# Patient Record
Sex: Female | Born: 1960 | Race: White | Hispanic: No | Marital: Married | Smoking: Never smoker
Health system: Southern US, Community
[De-identification: ages and names within clinical notes are randomized; demographics above are authoritative.]

## PROBLEM LIST (undated history)

## (undated) DIAGNOSIS — Z9889 Other specified postprocedural states: Secondary | ICD-10-CM

## (undated) DIAGNOSIS — K219 Gastro-esophageal reflux disease without esophagitis: Secondary | ICD-10-CM

## (undated) DIAGNOSIS — I10 Essential (primary) hypertension: Secondary | ICD-10-CM

## (undated) DIAGNOSIS — D649 Anemia, unspecified: Secondary | ICD-10-CM

## (undated) DIAGNOSIS — R112 Nausea with vomiting, unspecified: Secondary | ICD-10-CM

## (undated) DIAGNOSIS — F419 Anxiety disorder, unspecified: Secondary | ICD-10-CM

## (undated) DIAGNOSIS — F32A Depression, unspecified: Secondary | ICD-10-CM

## (undated) DIAGNOSIS — E785 Hyperlipidemia, unspecified: Secondary | ICD-10-CM

## (undated) DIAGNOSIS — IMO0002 Reserved for concepts with insufficient information to code with codable children: Secondary | ICD-10-CM

## (undated) DIAGNOSIS — F329 Major depressive disorder, single episode, unspecified: Secondary | ICD-10-CM

## (undated) HISTORY — DX: Hyperlipidemia, unspecified: E78.5

## (undated) HISTORY — DX: Reserved for concepts with insufficient information to code with codable children: IMO0002

## (undated) HISTORY — PX: CHOLECYSTECTOMY: SHX55

## (undated) HISTORY — DX: Essential (primary) hypertension: I10

## (undated) HISTORY — DX: Depression, unspecified: F32.A

## (undated) HISTORY — DX: Gastro-esophageal reflux disease without esophagitis: K21.9

## (undated) HISTORY — DX: Anxiety disorder, unspecified: F41.9

## (undated) HISTORY — DX: Major depressive disorder, single episode, unspecified: F32.9

---

## 1984-02-11 HISTORY — PX: ABDOMINAL HYSTERECTOMY: SHX81

## 1994-02-10 HISTORY — PX: INCONTINENCE SURGERY: SHX676

## 1997-09-19 ENCOUNTER — Ambulatory Visit (HOSPITAL_COMMUNITY): Admission: RE | Admit: 1997-09-19 | Discharge: 1997-09-19 | Payer: Self-pay | Admitting: Obstetrics and Gynecology

## 1997-09-26 ENCOUNTER — Ambulatory Visit (HOSPITAL_COMMUNITY): Admission: RE | Admit: 1997-09-26 | Discharge: 1997-09-26 | Payer: Self-pay | Admitting: *Deleted

## 1999-10-21 ENCOUNTER — Encounter: Payer: Self-pay | Admitting: Urology

## 1999-10-23 ENCOUNTER — Ambulatory Visit (HOSPITAL_COMMUNITY): Admission: RE | Admit: 1999-10-23 | Discharge: 1999-10-24 | Payer: Self-pay | Admitting: Urology

## 1999-12-12 ENCOUNTER — Other Ambulatory Visit: Admission: RE | Admit: 1999-12-12 | Discharge: 1999-12-12 | Payer: Self-pay | Admitting: *Deleted

## 2000-03-12 ENCOUNTER — Encounter: Admission: RE | Admit: 2000-03-12 | Discharge: 2000-03-12 | Payer: Self-pay | Admitting: Family Medicine

## 2000-03-12 ENCOUNTER — Encounter: Payer: Self-pay | Admitting: Family Medicine

## 2002-03-15 ENCOUNTER — Encounter: Admission: RE | Admit: 2002-03-15 | Discharge: 2002-03-15 | Payer: Self-pay | Admitting: Family Medicine

## 2002-03-15 ENCOUNTER — Encounter: Payer: Self-pay | Admitting: Family Medicine

## 2002-12-22 ENCOUNTER — Encounter: Admission: RE | Admit: 2002-12-22 | Discharge: 2002-12-22 | Payer: Self-pay | Admitting: Obstetrics and Gynecology

## 2003-06-11 ENCOUNTER — Encounter (INDEPENDENT_AMBULATORY_CARE_PROVIDER_SITE_OTHER): Payer: Self-pay | Admitting: *Deleted

## 2003-06-11 LAB — CONVERTED CEMR LAB

## 2003-07-19 ENCOUNTER — Other Ambulatory Visit: Admission: RE | Admit: 2003-07-19 | Discharge: 2003-07-19 | Payer: Self-pay | Admitting: Obstetrics & Gynecology

## 2003-10-30 ENCOUNTER — Ambulatory Visit: Payer: Self-pay | Admitting: Family Medicine

## 2003-11-16 ENCOUNTER — Ambulatory Visit: Payer: Self-pay | Admitting: Family Medicine

## 2003-11-21 ENCOUNTER — Ambulatory Visit: Payer: Self-pay | Admitting: Family Medicine

## 2004-01-30 ENCOUNTER — Ambulatory Visit: Payer: Self-pay | Admitting: Family Medicine

## 2004-02-22 ENCOUNTER — Ambulatory Visit: Payer: Self-pay | Admitting: Family Medicine

## 2004-05-16 ENCOUNTER — Ambulatory Visit: Payer: Self-pay | Admitting: Sports Medicine

## 2004-06-17 ENCOUNTER — Emergency Department (HOSPITAL_COMMUNITY): Admission: EM | Admit: 2004-06-17 | Discharge: 2004-06-17 | Payer: Self-pay | Admitting: Family Medicine

## 2004-06-27 ENCOUNTER — Ambulatory Visit: Payer: Self-pay | Admitting: Family Medicine

## 2004-08-05 ENCOUNTER — Ambulatory Visit: Payer: Self-pay | Admitting: Family Medicine

## 2004-09-20 ENCOUNTER — Ambulatory Visit: Payer: Self-pay | Admitting: Family Medicine

## 2004-11-17 ENCOUNTER — Emergency Department (HOSPITAL_COMMUNITY): Admission: EM | Admit: 2004-11-17 | Discharge: 2004-11-17 | Payer: Self-pay | Admitting: Family Medicine

## 2005-01-16 ENCOUNTER — Ambulatory Visit: Payer: Self-pay | Admitting: Family Medicine

## 2005-01-21 ENCOUNTER — Encounter: Admission: RE | Admit: 2005-01-21 | Discharge: 2005-01-21 | Payer: Self-pay | Admitting: Sports Medicine

## 2005-06-25 ENCOUNTER — Ambulatory Visit: Payer: Self-pay | Admitting: Family Medicine

## 2005-11-03 ENCOUNTER — Ambulatory Visit: Payer: Self-pay | Admitting: Family Medicine

## 2006-02-19 ENCOUNTER — Encounter (INDEPENDENT_AMBULATORY_CARE_PROVIDER_SITE_OTHER): Payer: Self-pay | Admitting: Family Medicine

## 2006-02-19 ENCOUNTER — Ambulatory Visit: Payer: Self-pay | Admitting: Sports Medicine

## 2006-02-19 LAB — CONVERTED CEMR LAB
Sex Hormone Binding: 43 nmol/L (ref 18–114)
Testosterone Free: 2 pg/mL (ref 0.6–6.8)
Testosterone-% Free: 1.5 % (ref 0.4–2.4)
Testosterone: 13.13 ng/dL (ref 10–70)

## 2006-02-25 ENCOUNTER — Ambulatory Visit: Payer: Self-pay | Admitting: Family Medicine

## 2006-04-07 ENCOUNTER — Encounter: Admission: RE | Admit: 2006-04-07 | Discharge: 2006-04-07 | Payer: Self-pay | Admitting: Sports Medicine

## 2006-04-07 ENCOUNTER — Ambulatory Visit: Payer: Self-pay | Admitting: Family Medicine

## 2006-04-09 DIAGNOSIS — F528 Other sexual dysfunction not due to a substance or known physiological condition: Secondary | ICD-10-CM | POA: Insufficient documentation

## 2006-04-09 DIAGNOSIS — F339 Major depressive disorder, recurrent, unspecified: Secondary | ICD-10-CM | POA: Insufficient documentation

## 2006-04-09 DIAGNOSIS — M545 Low back pain, unspecified: Secondary | ICD-10-CM | POA: Insufficient documentation

## 2006-04-09 DIAGNOSIS — K219 Gastro-esophageal reflux disease without esophagitis: Secondary | ICD-10-CM | POA: Insufficient documentation

## 2006-04-09 DIAGNOSIS — G4700 Insomnia, unspecified: Secondary | ICD-10-CM | POA: Insufficient documentation

## 2006-04-09 DIAGNOSIS — F411 Generalized anxiety disorder: Secondary | ICD-10-CM | POA: Insufficient documentation

## 2006-04-09 DIAGNOSIS — I10 Essential (primary) hypertension: Secondary | ICD-10-CM | POA: Insufficient documentation

## 2006-04-09 DIAGNOSIS — N951 Menopausal and female climacteric states: Secondary | ICD-10-CM | POA: Insufficient documentation

## 2006-04-10 ENCOUNTER — Encounter (INDEPENDENT_AMBULATORY_CARE_PROVIDER_SITE_OTHER): Payer: Self-pay | Admitting: *Deleted

## 2006-04-22 ENCOUNTER — Encounter (INDEPENDENT_AMBULATORY_CARE_PROVIDER_SITE_OTHER): Payer: Self-pay | Admitting: Family Medicine

## 2006-07-09 ENCOUNTER — Encounter: Admission: RE | Admit: 2006-07-09 | Discharge: 2006-07-09 | Payer: Self-pay | Admitting: Neurosurgery

## 2006-07-31 ENCOUNTER — Encounter (INDEPENDENT_AMBULATORY_CARE_PROVIDER_SITE_OTHER): Payer: Self-pay | Admitting: Family Medicine

## 2006-08-13 ENCOUNTER — Encounter: Admission: RE | Admit: 2006-08-13 | Discharge: 2006-08-13 | Payer: Self-pay | Admitting: Neurosurgery

## 2006-09-03 ENCOUNTER — Encounter: Admission: RE | Admit: 2006-09-03 | Discharge: 2006-09-03 | Payer: Self-pay | Admitting: Orthopaedic Surgery

## 2006-09-24 ENCOUNTER — Encounter: Admission: RE | Admit: 2006-09-24 | Discharge: 2006-09-24 | Payer: Self-pay | Admitting: Orthopaedic Surgery

## 2006-10-20 ENCOUNTER — Ambulatory Visit: Payer: Self-pay | Admitting: Gastroenterology

## 2006-10-26 ENCOUNTER — Encounter
Admission: RE | Admit: 2006-10-26 | Discharge: 2006-10-27 | Payer: Self-pay | Admitting: Physical Medicine & Rehabilitation

## 2006-10-27 ENCOUNTER — Ambulatory Visit: Payer: Self-pay | Admitting: Physical Medicine & Rehabilitation

## 2006-11-18 ENCOUNTER — Ambulatory Visit: Payer: Self-pay | Admitting: Gastroenterology

## 2007-04-29 DIAGNOSIS — I10 Essential (primary) hypertension: Secondary | ICD-10-CM | POA: Insufficient documentation

## 2008-04-25 ENCOUNTER — Encounter: Admission: RE | Admit: 2008-04-25 | Discharge: 2008-04-25 | Payer: Self-pay | Admitting: Obstetrics and Gynecology

## 2009-04-26 ENCOUNTER — Encounter: Admission: RE | Admit: 2009-04-26 | Discharge: 2009-04-26 | Payer: Self-pay | Admitting: Obstetrics and Gynecology

## 2009-07-25 ENCOUNTER — Encounter: Admission: RE | Admit: 2009-07-25 | Discharge: 2009-07-25 | Payer: Self-pay | Admitting: Internal Medicine

## 2010-02-07 ENCOUNTER — Emergency Department (HOSPITAL_COMMUNITY)
Admission: EM | Admit: 2010-02-07 | Discharge: 2010-02-07 | Payer: Self-pay | Source: Home / Self Care | Admitting: Family Medicine

## 2010-02-10 ENCOUNTER — Emergency Department (HOSPITAL_COMMUNITY)
Admission: EM | Admit: 2010-02-10 | Discharge: 2010-02-10 | Payer: Self-pay | Source: Home / Self Care | Admitting: Emergency Medicine

## 2010-03-14 NOTE — Procedures (Signed)
Summary: Gastroenterology EGD  Gastroenterology EGD   Imported By: Lowry Ram CMA 05/10/2007 12:12:06  _____________________________________________________________________  External Attachment:    Type:   Image     Comment:   External Document

## 2010-03-14 NOTE — Consult Note (Signed)
Summary: [facility]  [facility]   Imported By: Haydee Salter 05/14/2006 14:54:37  _____________________________________________________________________  External Attachment:    Type:   Image     Comment:   External Document

## 2010-03-14 NOTE — Consult Note (Signed)
Summary: [facility]  [facility]   Imported By: Bradly Bienenstock 07/31/2006 08:55:00  _____________________________________________________________________  External Attachment:    Type:   Image     Comment:   External Document

## 2010-04-22 LAB — POCT RAPID STREP A (OFFICE): Streptococcus, Group A Screen (Direct): POSITIVE — AB

## 2010-06-25 NOTE — Assessment & Plan Note (Signed)
[facility]                         GASTROENTEROLOGY OFFICE NOTE   Tiffany Bean, Tiffany Bean                      MRN:          [id number]  DATE:10/20/2006                            DOB:          [birth year]    REFERRING PHYSICIAN:  Gaspar Garbe, M.D.   REASON FOR REFERRAL:  Dr. Wylene Simmer asked me to evaluate Tiffany Bean in  consultation regarding dyspepsia, GERD symptoms, belching, H. pylori  positive.   HISTORY OF PRESENT ILLNESS:  Tiffany Bean is a very pleasant 50 year old  woman who has had several years of classic GERD symptoms.  She describes  burning in her chest, indigestion, belching that would be quite severe  if she did not take any of her omeprazole.  She takes omeprazole on a  nightly basis shortly before she goes to bed.  She has had 3 episodes of  hiccups and belching with worse-than-usual acid taste in her mouth.  These episodes have been over the past year or so and can last as long  as 1 week.  She said only time usually helps with these.  She has had a  separate issue of left-sided spasms that will occur when she twists or  turns in specific positions.  The pain will last for 30 seconds and then  will go away.  She will also rub her side to make it get better.  This  happens approximately 2 to 3 times a day.   She has no dysphagia.  She has no vomiting.  No nausea.   REVIEW OF SYSTEMS:  Notable for a 25-pound weight gain in the past year.  Otherwise, is essentially normal and noted on the nursing intake sheet.   PAST MEDICAL HISTORY:  Hypertension status post cholecystectomy in 1995,  hysterectomy 1997, tubal ligation 1986.   CURRENT MEDICATIONS:  Femtrace.  Bupropion.  Hydrochlorothiazide.  Fluoxetine.  Omeprazole nightly.   ALLERGIES:  CODEINE causes nausea and vomiting.   SOCIAL HISTORY:  Single.  Used to work at [employer] at [employer].  Nondrinker.  Nonsmoker.  Does not consume much caffeine, peppermint, or   chocolate.   FAMILY HISTORY:  No colon cancer or colon polyps in the family.   PHYSICAL EXAM:  Height 5 feet 2 inches, 233 pounds for a calculated BMI  of 43.  Blood pressure 122/80, pulse 60.  CONSTITUTIONAL:  Morbidly obese, otherwise well-appearing.  NEUROLOGIC:  Alert and oriented x3.  EYES:  Extraocular movements are intact.  MOUTH:  Oropharynx moist.  No lesions.  NECK:  Supple.  No lymphadenopathy.  CARDIOVASCULAR:  Heart is regular rate and rhythm.  LUNGS:  Clear to auscultation bilaterally.  ABDOMEN:  Soft, nontender, nondistended, normal bowel sounds.  EXTREMITIES:  No lower extremity edema.  SKIN:  No rashes or lesions on the visible extremities.   ASSESSMENT AND PLAN:  A 50 year old woman with classic gastroesophageal  reflux disease symptoms with probably acute worsening, perhaps  esophagitis.   I did not mention above, but she has had recent blood tests for a CBC  showing a slightly elevated white count of 14.6.  She is not  anemic.  Complete metabolic profile was normal.  She was H. pylori positive on  lab testing.  Her urinalysis was fairly normal.   I do think she has GERD symptoms but she is not taking omeprazole at the  correct time in relation to foods.  I recommend she change that to  taking it 20-30 minutes prior to her dinner meals as that is the most  reliable meal of the day.  I will give her a GERD handout.  We will also  arrange for her to have an EGD performed after 3 to 4 weeks of this  change in her antacid medicines.  The left-sided pain is very  positional.  The pain lasts only 20-30 seconds.  It is most likely  musculoskeletal.  I told her that, perhaps some of this is due to her  dramatic weight gain in the past couple of years.  Tylenol taken once or  twice a day may help this left-sided discomfort.     Rachael Fee, MD  Electronically Signed    DPJ/MedQ  DD: 10/20/2006  DT: 10/21/2006  Job #: [id number]   cc:   Gaspar Garbe, M.D.

## 2010-06-25 NOTE — Group Therapy Note (Signed)
Ms. Ringer is a 50 year old female.  Consult requested by Dr. Hilda Lias.  Consult requested for the evaluation of cervicalgia and  lumbago; pain management requested.   Ms. Sagen is a pleasant 50 year old female who gives the 49-month  history of hip pain, 70-month history of knee pain, on-and-off history of  elbow pain, and longer-term neck and headache pain.  She has had  neurosurgical evaluation per Dr. Jeral Fruit April 22, 2006, felt to have  possible L2-3 disc versus neuralgia paresthetica, given five Toradol.  She has had cervical and lumbar myelogram indicating minimal central  disc L2-3 without significant stenosis, mild facet hypertrophy L4-5 and  L5-S1, central disc at L5-S1 without stenosis.  On her cervical spine  she had foraminal narrowing of left greater than right at C5-6, C6-7;  central canal stenosis at C5-6 with moderate narrowing due to disc  osteophyte complex; mild central canal narrowing at C4-5 due to broad-  based disc bulge; and moderate to severe right foraminal narrowing at C3-  4.   She has had pelvic x-rays back in February ordered by Dr. Darrick Penna showing  transitional lumbosacral vertebra with prominent transverse processes  articulating or possibly fused with sacrum.  SI joints looked okay.  She  has had a lateral femoral cutaneous nerve injection per Dr. Jeral Fruit but  this did not result in any improvement in her left hip pain.  She has  also had some flank pain on the left side which is intermittent in  nature.   Her average pain is 7 but currently 5.  Described as burning, dull,  stabbing, constant, aching.  Pain increases with activity, is about a  7/10 level, sleep is poor.  Relief from medications is fair.  She can  walk without limits, she can drive.  She does not climb steps due to  knee pain, particularly with descending stairs.  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   REVIEW OF SYSTEMS:  Positive for numbness in her left leg, trouble  walking, spasms, depression.  She has diarrhea only when she has  headache or leg pain flareups.  She has gained 25 pounds over the last  year or maybe 2 years.   She has had medical evaluation including normal thyroid function tests.   PAST SURGICAL HISTORY:  1. Tubal ligation 1986.  2. Cholecystectomy 1993.  3. Hysterectomy 1997.  4. Pubovaginal sling 2001.   SOCIAL HISTORY:  Married, lives with her husband.  No alcohol, smoking  or illegal drug abuse.   FAMILY HISTORY:  Father with diabetes.  Has family history of heart  disease, lung disease and disability.   OTHER TREATMENTS TRIALED:  She has had L4-5 translaminar epidural  steroid injection August 14 and September 03, 2006 with only a couple of  days' relief each time.   Her blood pressure is 122/55, pulse 71, respirations 18, O2 saturation  94% in room air.  In general, no acute distress.  Mood and affect  appropriate.  Her back has no tenderness to palpation.  She has no  evidence of scoliosis.  She has pain with extension greater than with  flexion.  She has upper extremity and lower extremity strength and range  of motion normal, but she does have some back pain with Faber's maneuver  bilaterally in the SI  area.  Straight leg raising test is negative.  Deep tendon reflexes are normal.  She has mild crepitus bilateral knees.  Hip ROM good. She has no joint swelling in the knees, no erythema.  Her  lower extremity pulses are normal.  Mood and affect is appropriate.  Her  neck range of motion is good.  She has no pain with palpation of  fibromyalgia tender points.   IMPRESSION:  1. Cervical spinal stenosis without evidence of radiculopathy.  I do      think she has cervicogenic headaches.  2. Lumbar facet arthropathy with probable main pain generator.  This      will be further  evaluated with lumbar medial branch blocks.  I      suspect her hip pain may be related to her lumbar facet arthropathy      as well.  3. Knee pain.  Given her weight I would be suspicious for early      osteoarthritis.  I have discussed weight management as part of      overall treatment plan.  She has recently cut out sodas but would      be potentially interested in some nutritional counseling, which can      be performed at [facility].   Also, I think that she can benefit from physical therapy but I would  first do medial branch blocks in order to better identify pain  generator.  She may be a candidate for lumbar radiofrequency neurotomy,  depending on response to medical branch blocks.   Thank you very much for this interesting consultation.      Erick Colace, M.D.  Electronically Signed     AEK/MedQ  D:  10/27/2006 15:43:57  T:  10/28/2006 08:57:53  Job #:  [id number]   cc:   Hilda Lias, M.D.  Fax: [phone]   Dr. Reatha Harps, M.D.  Fax: [phone]

## 2010-06-28 NOTE — H&P (Signed)
[place]  Patient:    Tiffany Bean, Tiffany Bean                      MRN: [id number] Adm. Date:  [id number] Attending:  Lauree Chandler                         History and Physical  HISTORY OF PRESENT ILLNESS:  This 50 year old white female has had a several-month history of classic stress incontinence.  She leaks with sneezing, coughing, and laughing.  She has no urgency incontinence.  She needs to wear pads.  She finds this very embarrassing.  Urodynamic studies revealed no uninhibited bladder contractions.  She did leak with bearing down and coughing at medium to high pressures.  She was counseled about therapy, and wants to go ahead with a pubovaginal sling, and she is advised about the risks of bladder injury, hemorrhage, or infection.  She knows about her suprapubic tube.  PAST MEDICAL HISTORY: 1. She has had a vaginal hysterectomy in the past. 2. She has had two vaginal deliveries. 3. Recently she has had some problems with urinary tract infections. 4. Cervical arthritis. 5. Esophageal reflux. 6. She has some depression. 7. A cholecystectomy.  CURRENT MEDICATIONS: 1. Alprazolam 0.5 mg q.d. 2. Prozac 40 mg q.d. 3. Protonix 40 mg q.d. 4. Vioxx 25 mg q.d.  ALLERGIES:  She cannot tolerate CODEINE.  SOCIAL HISTORY:  Tobacco:  None.  Alcohol:  None.  REVIEW OF SYSTEMS:  Noted in her health history form.  PHYSICAL EXAMINATION:  VITAL SIGNS:  Blood pressure 152/80, pulse 50, heart rate 16, temperature 97.1 degrees.  GENERAL:  She is alert and oriented.  She is in no acute distress.  She is a heavyset white female.  SKIN:  Warm and dry.  ABDOMEN:  Soft, nontender.  LUNGS:  No respiratory distress.  GENITOURINARY:  She has a mild cystocele.  External genitalia otherwise unremarkable.  HEART:  She has had a workup for mild cardiomegaly, which an echocardiogram did not reveal any serious dysfunction.  IMPRESSION: 1. Stress  incontinence. 2. Gastroesophageal reflux disease. 3. Cervical spine arthritis. 4. Cardiomegaly.  PLAN:  A pubovaginal sling. DD:  10/23/99 TD:  10/23/99 Job: [id number]

## 2010-06-28 NOTE — Op Note (Signed)
[place]  Patient:    Tiffany Bean, Tiffany Bean                      MRN: [id number] Proc. Date: 10/23/99 Adm. Date:  [id number] Disc. Date: [id number] Attending:  Lauree Chandler                           Operative Report  PREOPERATIVE DIAGNOSIS:  Stress urinary incontinence.  POSTOPERATIVE DIAGNOSIS:  Stress urinary incontinence.  PROCEDURE:  Pubovaginal sling.  SURGEON:  Maretta Bees. Vonita Moss, M.D.  ANESTHESIA:  General.  INDICATIONS:  This 50 year old had has significant stress urinary incontinence and wearing pads and has undergone workup that indicated a pubovaginal sling would be a good procedure for her.  PROCEDURE:  The patient brought to the operating room and placed in lithotomy position.  External genitalia were prepped and draped in usual fashion.  Foley catheter was inserted.  The vaginal mucosa was injected suburethrally with Xylocaine and epinephrine.  Midline incision was made suburethrally and vaginal flaps dissected out laterally to obtain access to the endopelvic fascia.  Attention was then turned to the suprapubic area where two, 1 inch incisions were made on each side of the midline transversely.  Dissection was carried down to the rectus fascia.  The fascial graft was previously soaked in triple antibiotic solution and 1 Prolene sutured into each end and at this point the Raz needle holder was used to penetrate the rectus fascia above the symphysis pubis after we had perforated the endopelvic fascia bilaterally. The Lynder Parents was brought down through each suprapubic incisional site, and into the periurethral area to bring out the Prolenes on each side.  The fascial graft laid in good position and was sutured in place from the bladder neck and distally with a 3-0 Vicryl proximally and a 3-0 Vicryl distally.  The wound was irrigated with triple antibiotic solution and the vaginal incision closed with running 2-0 Vicryl.  I then  cystoscoped her and the bladder was unremarkable and tension on the Prolene sutures on each side showed slight movement on each side of the bladder neck well away from the ureteral orifices.  With the bladder full and the patient in Trendelenburg position the microinvasive suprapubic tube was inserted percutaneously through a stabilized wound and brought through the anterior bladder wall just distal to the air bubble.  A foil coil was made in the suprapubic tube and it was later connected to close drainage and sutured in place with a black silk suture.  The Prolene sutures on each side of the wound securing the fascial graft were tied down at skin level over scissors and then sutures tied and the wounds irrigated with triple antibiotic solution and the suprapubic wounds closed with skin staples.  The vaginal canal was packed with Neosporin soaked gauze. Sponge, needle and instrument counts were correct.  The patient tolerated the procedure well.  ESTIMATED BLOOD LOSS:  150-200 cc.  DISPOSITION:  She was taken to the recovery room in good condition. DD:  10/23/99 TD:  10/24/99 Job: [id number]

## 2010-08-08 ENCOUNTER — Telehealth (INDEPENDENT_AMBULATORY_CARE_PROVIDER_SITE_OTHER): Payer: Self-pay

## 2010-08-08 ENCOUNTER — Other Ambulatory Visit (INDEPENDENT_AMBULATORY_CARE_PROVIDER_SITE_OTHER): Payer: Self-pay

## 2011-02-26 ENCOUNTER — Encounter (INDEPENDENT_AMBULATORY_CARE_PROVIDER_SITE_OTHER): Payer: Self-pay | Admitting: Surgery

## 2011-02-26 ENCOUNTER — Ambulatory Visit (INDEPENDENT_AMBULATORY_CARE_PROVIDER_SITE_OTHER): Payer: BC Managed Care – PPO | Admitting: Surgery

## 2011-02-26 VITALS — BP 132/78 | HR 76 | Temp 99.0°F | Resp 16 | Ht 62.0 in | Wt 241.0 lb

## 2011-02-26 DIAGNOSIS — E669 Obesity, unspecified: Secondary | ICD-10-CM | POA: Insufficient documentation

## 2011-02-26 NOTE — Progress Notes (Signed)
Chief Complaint:  Morbid obesity BMI 44  History of Present Illness:  Tiffany Bean is an 51 y.o. female [employer] employee who is interested in gastric bypass surgery. 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 Since she gained 60 pounds with her first child she has been unsuccessful in managing her obesity successfully. Last year she lost almost 50 pounds and doing a non-eating diet but has regained. She wants to proceed with workup for microscopic Roux-en-Y gastric bypass.  Past Medical History  Diagnosis Date  . Hypertension   . Hyperlipidemia   . Anxiety   . Depression   . GERD (gastroesophageal reflux disease)   . Degenerative disc disease     Past Surgical History  Procedure Date  . Cholecystectomy 1995?  Marland Kitchen Abdominal hysterectomy 1986  . Incontinence surgery 1996    Current Outpatient Prescriptions  Medication Sig Dispense Refill  . buPROPion (WELLBUTRIN XL) 300 MG 24 hr tablet Take 300 mg by mouth daily.      Marland Kitchen Dexlansoprazole (DEXILANT PO) Take 50 mg by mouth 1 day or 1 dose.      . DULoxetine (CYMBALTA) 60 MG capsule Take 60 mg by mouth daily.      . hydrochlorothiazide (HYDRODIURIL) 25 MG tablet Take 25 mg by mouth daily.      . phentermine (ADIPEX-P) 37.5 MG tablet Take 37.5 mg by mouth daily before breakfast.      . simvastatin (ZOCOR) 40 MG tablet Take 40 mg by mouth every evening.       Codeine Family History  Problem Relation Age of Onset  . Heart disease Father   . COPD Father    Social History:   reports that she has never smoked. She does not have any smokeless tobacco history on file. She reports that she does not drink alcohol or use illicit drugs.   REVIEW OF SYSTEMS - PERTINENT POSITIVES ONLY: Noncontributory.  Physical Exam:   Blood pressure 132/78, pulse 76, temperature 99 F (37.2 C), temperature source Temporal, resp. rate 16, height 5\' 2"  (1.575 m), weight 241 lb  (109.317 kg). Body mass index is 44.08 kg/(m^2).  Gen:  WDWN white female NAD  Neurological: Alert and oriented to person, place, and time. Motor and sensory function is grossly intact  Head: Normocephalic and atraumatic.  Eyes: Conjunctivae are normal. Pupils are equal, round, and reactive to light. No scleral icterus.  Neck: Normal range of motion. Neck supple. No tracheal deviation or thyromegaly present.  Cardiovascular:  SR without murmurs or gallops.  No carotid bruits Respiratory: Effort normal.  No respiratory distress. No chest wall tenderness. Breath sounds normal.  No wheezes, rales or rhonchi.  Abdomen:  Not examined GU: Musculoskeletal: Normal range of motion. Extremities are nontender. No cyanosis, edema or clubbing noted Lymphadenopathy: No cervical, preauricular, postauricular or axillary adenopathy is present Skin: Skin is warm and dry. No rash noted. No diaphoresis. No erythema. No pallor. Pscyh: Normal mood and affect. Behavior is normal. Judgment and thought content normal.   LABORATORY RESULTS: No results found for this or any previous visit (from the past 48 hour(s)).  RADIOLOGY RESULTS: No results found.  Problem List: Patient Active Problem List  Diagnoses  . DEPRESSION, MAJOR, RECURRENT  . ANXIETY  . IMPOTENCE INORGANIC  . HYPERTENSION, BENIGN SYSTEMIC  . HYPERTENSION  . GASTROESOPHAGEAL REFLUX, NO ESOPHAGITIS  . MENOPAUSAL SYNDROME  . BACK PAIN, LOW  .  INSOMNIA NOS  BMI 44, no history of DVT but with essential hypertension. Followed by Dr. Rufina Falco We'll go ahead and begin the workup for Roux-en-Y gastric bypass.    Matt B. Daphine Deutscher, MD, [facility] [phone] beeper [phone]  02/26/2011 5:05 PM

## 2011-03-03 ENCOUNTER — Other Ambulatory Visit (INDEPENDENT_AMBULATORY_CARE_PROVIDER_SITE_OTHER): Payer: Self-pay | Admitting: General Surgery

## 2011-03-04 ENCOUNTER — Other Ambulatory Visit: Payer: Self-pay | Admitting: Nurse Practitioner

## 2011-03-04 DIAGNOSIS — Z1231 Encounter for screening mammogram for malignant neoplasm of breast: Secondary | ICD-10-CM

## 2011-03-06 ENCOUNTER — Ambulatory Visit
Admission: RE | Admit: 2011-03-06 | Discharge: 2011-03-06 | Disposition: A | Discharge status: Home / Self Care | Payer: BC Managed Care – PPO | Source: Ambulatory Visit | Attending: Nurse Practitioner | Admitting: Nurse Practitioner

## 2011-03-06 DIAGNOSIS — Z1231 Encounter for screening mammogram for malignant neoplasm of breast: Secondary | ICD-10-CM

## 2011-03-11 ENCOUNTER — Other Ambulatory Visit: Payer: Self-pay | Admitting: Nurse Practitioner

## 2011-03-11 DIAGNOSIS — R928 Other abnormal and inconclusive findings on diagnostic imaging of breast: Secondary | ICD-10-CM

## 2011-03-12 ENCOUNTER — Ambulatory Visit
Admission: RE | Admit: 2011-03-12 | Discharge: 2011-03-12 | Disposition: A | Discharge status: Home / Self Care | Payer: BC Managed Care – PPO | Source: Ambulatory Visit | Attending: Nurse Practitioner | Admitting: Nurse Practitioner

## 2011-03-12 DIAGNOSIS — R928 Other abnormal and inconclusive findings on diagnostic imaging of breast: Secondary | ICD-10-CM

## 2011-03-13 ENCOUNTER — Ambulatory Visit (HOSPITAL_COMMUNITY)
Admission: RE | Admit: 2011-03-13 | Discharge: 2011-03-13 | Disposition: A | Discharge status: Home / Self Care | Payer: BC Managed Care – PPO | Source: Ambulatory Visit | Attending: Surgery | Admitting: Surgery

## 2011-03-13 ENCOUNTER — Other Ambulatory Visit: Payer: Self-pay

## 2011-03-13 ENCOUNTER — Other Ambulatory Visit (INDEPENDENT_AMBULATORY_CARE_PROVIDER_SITE_OTHER): Payer: Self-pay | Admitting: Surgery

## 2011-03-13 DIAGNOSIS — K219 Gastro-esophageal reflux disease without esophagitis: Secondary | ICD-10-CM | POA: Insufficient documentation

## 2011-03-13 DIAGNOSIS — K449 Diaphragmatic hernia without obstruction or gangrene: Secondary | ICD-10-CM | POA: Insufficient documentation

## 2011-03-14 LAB — T4: T4, Total: 9.5 ug/dL (ref 5.0–12.5)

## 2011-03-14 LAB — COMPREHENSIVE METABOLIC PANEL
ALT: 28 U/L (ref 0–35)
AST: 29 U/L (ref 0–37)
Albumin: 4.3 g/dL (ref 3.5–5.2)
Alkaline Phosphatase: 137 U/L — ABNORMAL HIGH (ref 39–117)
BUN: 16 mg/dL (ref 6–23)
CO2: 27 mEq/L (ref 19–32)
Calcium: 9.7 mg/dL (ref 8.4–10.5)
Chloride: 101 mEq/L (ref 96–112)
Creat: 0.98 mg/dL (ref 0.50–1.10)
Glucose, Bld: 99 mg/dL (ref 70–99)
Potassium: 3.8 mEq/L (ref 3.5–5.3)
Sodium: 138 mEq/L (ref 135–145)
Total Bilirubin: 0.4 mg/dL (ref 0.3–1.2)
Total Protein: 7.6 g/dL (ref 6.0–8.3)

## 2011-03-14 LAB — CBC
HCT: 41.5 % (ref 36.0–46.0)
Hemoglobin: 12.9 g/dL (ref 12.0–15.0)
MCH: 28.1 pg (ref 26.0–34.0)
MCHC: 31.1 g/dL (ref 30.0–36.0)
MCV: 90.4 fL (ref 78.0–100.0)
Platelets: 357 10*3/uL (ref 150–400)
RBC: 4.59 MIL/uL (ref 3.87–5.11)
RDW: 14.4 % (ref 11.5–15.5)
WBC: 13.6 10*3/uL — ABNORMAL HIGH (ref 4.0–10.5)

## 2011-03-14 LAB — TSH: TSH: 1.34 u[IU]/mL (ref 0.350–4.500)

## 2011-03-24 ENCOUNTER — Encounter: Payer: BC Managed Care – PPO | Attending: Surgery | Admitting: *Deleted

## 2011-03-24 ENCOUNTER — Encounter: Payer: Self-pay | Admitting: *Deleted

## 2011-03-24 DIAGNOSIS — E669 Obesity, unspecified: Secondary | ICD-10-CM

## 2011-03-24 DIAGNOSIS — Z713 Dietary counseling and surveillance: Secondary | ICD-10-CM | POA: Insufficient documentation

## 2011-03-24 DIAGNOSIS — Z01818 Encounter for other preprocedural examination: Secondary | ICD-10-CM | POA: Insufficient documentation

## 2011-03-24 NOTE — Progress Notes (Signed)
  Pre-Op Assessment Visit: Pre-Operative Gastric Bypass Surgery  Medical Nutrition Therapy:  Appt start time: 0730 end time:  0815.  Patient was seen on 03/24/2011 for Pre-Operative Gastric Bypass Nutrition Assessment. Assessment and letter of approval faxed to [facility] coordinator on 03/24/2011.  Approval letter sent to [facility] center and will be available in the chart under the media tab.  Handouts given during visit include:  Pre-Op Goals Handout  Patient to call for Pre-Op and Post-Op Nutrition Education at the [facility] when surgery is scheduled.

## 2011-03-24 NOTE — Patient Instructions (Signed)
   Follow Pre-Op Nutrition Goals to prepare for Gastric Bypass Surgery.   Call the [facility] at [phone] once you have been given your surgery date to enrolled in the Pre-Op Nutrition Class. You will need to attend this nutrition class 3-4 weeks prior to your surgery. 

## 2011-04-25 ENCOUNTER — Ambulatory Visit (INDEPENDENT_AMBULATORY_CARE_PROVIDER_SITE_OTHER): Payer: BC Managed Care – PPO | Admitting: Surgery

## 2011-04-25 ENCOUNTER — Encounter (INDEPENDENT_AMBULATORY_CARE_PROVIDER_SITE_OTHER): Payer: Self-pay | Admitting: Surgery

## 2011-04-25 VITALS — BP 124/86 | HR 72 | Temp 98.6°F | Resp 18 | Ht 62.0 in | Wt 240.6 lb

## 2011-04-25 DIAGNOSIS — E669 Obesity, unspecified: Secondary | ICD-10-CM

## 2011-04-25 NOTE — Patient Instructions (Signed)
Bowel prep

## 2011-04-25 NOTE — Progress Notes (Signed)
Chief Complaint:  Morbid obesity BMI 44  History of Present Illness:  Tiffany Bean is an 51 y.o. female [employer] employee who is interested in gastric bypass surgery. 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 Since she gained 60 pounds with her first child she has been unsuccessful in managing her obesity successfully. Last year she lost almost 50 pounds and doing a non-eating diet but has regained. She wants to proceed with workup for laparoscopic  Roux-en-Y gastric bypass.  Past Medical History  Diagnosis Date  . Hypertension   . Hyperlipidemia   . Anxiety   . Depression   . GERD (gastroesophageal reflux disease)   . Degenerative disc disease     Past Surgical History  Procedure Date  . Cholecystectomy 1995?  Marland Kitchen Abdominal hysterectomy 1986  . Incontinence surgery 1996    Current Outpatient Prescriptions  Medication Sig Dispense Refill  . bisoprolol-hydrochlorothiazide (ZIAC) 5-6.25 MG per tablet daily.      Marland Kitchen buPROPion (WELLBUTRIN XL) 300 MG 24 hr tablet Take 300 mg by mouth daily.      Marland Kitchen Dexlansoprazole (DEXILANT PO) Take 50 mg by mouth 1 day or 1 dose.      . DULoxetine (CYMBALTA) 60 MG capsule Take 60 mg by mouth daily.      . phentermine (ADIPEX-P) 37.5 MG tablet Take 37.5 mg by mouth daily before breakfast.      . simvastatin (ZOCOR) 40 MG tablet Take 40 mg by mouth every evening.      . valACYclovir (VALTREX) 500 MG tablet Ad lib.       Codeine Family History  Problem Relation Age of Onset  . Heart disease Father   . COPD Father    Social History:   reports that she has never smoked. She does not have any smokeless tobacco history on file. She reports that she does not drink alcohol or use illicit drugs.   REVIEW OF SYSTEMS - PERTINENT POSITIVES ONLY: Noncontributory.  Physical Exam:   Blood pressure 124/86, pulse 72, temperature 98.6 F (37 C), temperature source Temporal, resp. rate  18, height 5\' 2"  (1.575 m), weight 240 lb 9.6 oz (109.135 kg). Body mass index is 44.01 kg/(m^2).  Gen:  WDWN white female NAD  Neurological: Alert and oriented to person, place, and time. Motor and sensory function is grossly intact  Head: Normocephalic and atraumatic.  Eyes: Conjunctivae are normal. Pupils are equal, round, and reactive to light. No scleral icterus.  Neck: Normal range of motion. Neck supple. No tracheal deviation or thyromegaly present.  Cardiovascular:  SR without murmurs or gallops.  No carotid bruits Respiratory: Effort normal.  No respiratory distress. No chest wall tenderness. Breath sounds normal.  No wheezes, rales or rhonchi.  Abdomen:  Non tender GU: Musculoskeletal: Normal range of motion. Extremities are nontender. No cyanosis, edema or clubbing noted Lymphadenopathy: No cervical, preauricular, postauricular or axillary adenopathy is present Skin: Skin is warm and dry. No rash noted. No diaphoresis. No erythema. No pallor. Pscyh: Normal mood and affect. Behavior is normal. Judgment and thought content normal.   LABORATORY RESULTS: No results found for this or any previous visit (from the past 48 hour(s)).  RADIOLOGY RESULTS: No results found.  Problem List: Patient Active Problem List  Diagnoses  . DEPRESSION, MAJOR, RECURRENT  . ANXIETY  . IMPOTENCE INORGANIC  . HYPERTENSION, BENIGN SYSTEMIC  . HYPERTENSION  . GASTROESOPHAGEAL REFLUX, NO  ESOPHAGITIS  . MENOPAUSAL SYNDROME  . BACK PAIN, LOW  . INSOMNIA NOS  . Obesity  BMI 44, no history of DVT but with essential hypertension. Followed by Dr. Gerlene Burdock Tisovic UGI showed small hiatus hernia.   Plan:  Lap roux en y gastric bypass    Matt B. Daphine Deutscher, MD, [facility] [phone] beeper [phone]  04/25/2011 11:23 AM

## 2011-04-30 ENCOUNTER — Encounter (HOSPITAL_COMMUNITY): Payer: Self-pay | Admitting: Pharmacy Technician

## 2011-05-01 ENCOUNTER — Encounter: Payer: BC Managed Care – PPO | Attending: Surgery | Admitting: *Deleted

## 2011-05-01 ENCOUNTER — Telehealth (INDEPENDENT_AMBULATORY_CARE_PROVIDER_SITE_OTHER): Payer: Self-pay | Admitting: General Surgery

## 2011-05-01 ENCOUNTER — Encounter (HOSPITAL_COMMUNITY): Payer: Self-pay

## 2011-05-01 ENCOUNTER — Encounter (HOSPITAL_COMMUNITY)
Admission: RE | Admit: 2011-05-01 | Discharge: 2011-05-01 | Disposition: A | Discharge status: Home / Self Care | Payer: BC Managed Care – PPO | Source: Ambulatory Visit | Attending: Surgery | Admitting: Surgery

## 2011-05-01 DIAGNOSIS — Z01818 Encounter for other preprocedural examination: Secondary | ICD-10-CM | POA: Insufficient documentation

## 2011-05-01 DIAGNOSIS — E669 Obesity, unspecified: Secondary | ICD-10-CM

## 2011-05-01 DIAGNOSIS — Z713 Dietary counseling and surveillance: Secondary | ICD-10-CM | POA: Insufficient documentation

## 2011-05-01 HISTORY — DX: Nausea with vomiting, unspecified: R11.2

## 2011-05-01 HISTORY — DX: Other specified postprocedural states: Z98.890

## 2011-05-01 LAB — SURGICAL PCR SCREEN
MRSA, PCR: NEGATIVE
Staphylococcus aureus: NEGATIVE

## 2011-05-01 LAB — CBC
HCT: 40 % (ref 36.0–46.0)
Hemoglobin: 12.8 g/dL (ref 12.0–15.0)
MCH: 28.1 pg (ref 26.0–34.0)
MCHC: 32 g/dL (ref 30.0–36.0)
MCV: 87.7 fL (ref 78.0–100.0)
Platelets: 439 10*3/uL — ABNORMAL HIGH (ref 150–400)
RBC: 4.56 MIL/uL (ref 3.87–5.11)
RDW: 14.2 % (ref 11.5–15.5)
WBC: 10.8 10*3/uL — ABNORMAL HIGH (ref 4.0–10.5)

## 2011-05-01 LAB — DIFFERENTIAL
Basophils Absolute: 0 10*3/uL (ref 0.0–0.1)
Basophils Relative: 0 % (ref 0–1)
Eosinophils Absolute: 0.5 10*3/uL (ref 0.0–0.7)
Eosinophils Relative: 5 % (ref 0–5)
Lymphocytes Relative: 35 % (ref 12–46)
Lymphs Abs: 3.8 10*3/uL (ref 0.7–4.0)
Monocytes Absolute: 0.6 10*3/uL (ref 0.1–1.0)
Monocytes Relative: 5 % (ref 3–12)
Neutro Abs: 5.9 10*3/uL (ref 1.7–7.7)
Neutrophils Relative %: 55 % (ref 43–77)

## 2011-05-01 LAB — COMPREHENSIVE METABOLIC PANEL
ALT: 32 U/L (ref 0–35)
AST: 27 U/L (ref 0–37)
Albumin: 3.7 g/dL (ref 3.5–5.2)
Alkaline Phosphatase: 130 U/L — ABNORMAL HIGH (ref 39–117)
BUN: 17 mg/dL (ref 6–23)
CO2: 29 mEq/L (ref 19–32)
Calcium: 9.7 mg/dL (ref 8.4–10.5)
Chloride: 99 mEq/L (ref 96–112)
Creatinine, Ser: 0.97 mg/dL (ref 0.50–1.10)
GFR calc Af Amer: 76 mL/min — ABNORMAL LOW (ref >90)
GFR calc non Af Amer: 66 mL/min — ABNORMAL LOW (ref >90)
Glucose, Bld: 96 mg/dL (ref 70–99)
Potassium: 4.1 mEq/L (ref 3.5–5.1)
Sodium: 136 mEq/L (ref 135–145)
Total Bilirubin: 0.4 mg/dL (ref 0.3–1.2)
Total Protein: 8 g/dL (ref 6.0–8.3)

## 2011-05-01 NOTE — Progress Notes (Signed)
  Bariatric Class:  Appt start time:  08:30 end time:  9:30.  Pre-Operative Nutrition Class Patient was seen on 05/01/2011 for Pre-Operative Bariatric Surgery Education at the [facility].  Surgery date: 05/12/11 Surgery type: Gastric Bypass  Samples given per MNT protocol: Bariatric Advantage Complete Multivitamin Jacques Earthly) Lot # [id number];  Exp:10/2011  Bariatric Advantage Calcium Citrate Lozenge Cinnamon - Lot # [id number];  Exp: 10/2011 Chocolate - Lot # [id number];  Exp: 11/2011  Bariatric Advantage VitaBand Complete Chewable MVI (Mixed Berry) Lot # [id number]; Exp: 10/2011  Bariatric Advantage Sublingual B-12 (Peppermint) Lot # [id number];  Exp: 06/2011  Celebrate Vitamins Calcium Plus 500 (Cherry Tart) Lot # [id number];  Exp: 07/2011  Celebrate Vitamins Calcium Plus Chewable (Strawberry Creme) Lot # [id number]; Exp: 05/2011  Celebrate Vitamins Sublingual B-12 Valentino Saxon) Lot # [id number];  Exp: 08/2012  TwinLab B-12 Dots Lot # [id number]; Exp: 11/2012  TwinLab Basic Essentials Shake Toni Arthurs) Lot # [id number]; Exp: 11/2012  The following the learning objective met by the patient during this course:  Identifies Pre-Op Dietary Goals and will begin 2 weeks pre-operatively   Identifies appropriate sources of fluids and proteins   States protein recommendations and appropriate sources pre and post-operatively  Identifies Post-Operative Dietary Goals and will follow for 2 weeks post-operatively  Identifies appropriate multivitamin and calcium sources  Describes the need for physical activity post-operatively and will follow MD recommendations  States when to call healthcare provider regarding medication questions or post-operative complications  Handouts given during class include:  Pre-Op Bariatric Surgery Diet Handout  Protein Shake Handout  Post-Op Bariatric Surgery Nutrition Handout  [facility] Price List  BELT Program Information Flyer  Support Group Information  Flyer  Follow-Up Plan: Patient will follow-up at [facility] 2 weeks post operatively for diet advancement per MD.

## 2011-05-01 NOTE — Patient Instructions (Signed)
[address] Eust[facility]  05/01/2011   Your procedure is scheduled on:  05/12/11 [id number]  Report to [facility] at 0515 AM.  Call this number if you have problems the morning of surgery: [phone]   Remember:   Do not eat food:After Midnight.  May have clear liquids:until Midnight .    Take these medicines the morning of surgery with A SIP OF WATER:    Do not wear jewelry, make-up or nail polish.  Do not wear lotions, powders, or perfumes.  Do not shave 48 hours prior to surgery.  Do not bring valuables to the hospital.  Contacts, dentures or bridgework may not be worn into surgery.  Leave suitcase in the car. After surgery it may be brought to your room.  For patients admitted to the hospital, checkout time is 11:00 AM the day of discharge.     Special Instructions: CHG Shower Use Special Wash: 1/2 bottle night before surgery and 1/2 bottle morning of surgery. shower chin to toes with CHG.  Wash face and private parts with regular soap.    Please read over the following fact sheets that you were given: MRSA Information, coughing and deep breathing exercises, leg exercises

## 2011-05-01 NOTE — Telephone Encounter (Signed)
Nurse at pre-op needs orders in Epic for pt coming in today.  Thanks

## 2011-05-01 NOTE — Patient Instructions (Signed)
Follow:   Pre-Op Diet per MD 2 weeks prior to surgery  Phase 2- Liquids (clear/full) 2 weeks after surgery  Vitamin/Mineral/Calcium guidelines for purchasing bariatric supplements  Exercise guidelines pre and post-op per MD  Follow-up at [facility] in 2 weeks post-op for diet advancement. Contact Shekina Cordell as needed with questions/concerns. 

## 2011-05-01 NOTE — Progress Notes (Signed)
05/01/11 1133  OBSTRUCTIVE SLEEP APNEA  Have you ever been diagnosed with sleep apnea through a sleep study? No  Do you snore loudly (loud enough to be heard through closed doors)?  0  Do you often feel tired, fatigued, or sleepy during the daytime? 1  Has anyone observed you stop breathing during your sleep? 0  Do you have, or are you being treated for high blood pressure? 1  BMI more than 35 kg/m2? 1  Age over 52 years old? 1  Neck circumference greater than 40 cm/18 inches? 0  Gender: 0  Obstructive Sleep Apnea Score 4   Score 4 or greater  Updated health history

## 2011-05-03 ENCOUNTER — Other Ambulatory Visit (INDEPENDENT_AMBULATORY_CARE_PROVIDER_SITE_OTHER): Payer: Self-pay | Admitting: Surgery

## 2011-05-12 ENCOUNTER — Inpatient Hospital Stay (HOSPITAL_COMMUNITY)
Admission: RE | Admit: 2011-05-12 | Discharge: 2011-05-15 | DRG: 288 | Disposition: A | Discharge status: Home / Self Care | Payer: BC Managed Care – PPO | Source: Ambulatory Visit | Attending: Surgery | Admitting: Surgery

## 2011-05-12 ENCOUNTER — Encounter (HOSPITAL_COMMUNITY): Payer: Self-pay | Admitting: Anesthesiology

## 2011-05-12 ENCOUNTER — Encounter (HOSPITAL_COMMUNITY)
Admission: RE | Disposition: A | Discharge status: Home / Self Care | Payer: Self-pay | Source: Ambulatory Visit | Attending: Surgery

## 2011-05-12 ENCOUNTER — Ambulatory Visit (HOSPITAL_COMMUNITY): Payer: BC Managed Care – PPO | Admitting: Anesthesiology

## 2011-05-12 ENCOUNTER — Encounter (HOSPITAL_COMMUNITY): Payer: Self-pay | Admitting: *Deleted

## 2011-05-12 DIAGNOSIS — F329 Major depressive disorder, single episode, unspecified: Secondary | ICD-10-CM | POA: Diagnosis present

## 2011-05-12 DIAGNOSIS — I10 Essential (primary) hypertension: Secondary | ICD-10-CM | POA: Diagnosis present

## 2011-05-12 DIAGNOSIS — Z6841 Body Mass Index (BMI) 40.0 and over, adult: Secondary | ICD-10-CM

## 2011-05-12 DIAGNOSIS — E785 Hyperlipidemia, unspecified: Secondary | ICD-10-CM | POA: Diagnosis present

## 2011-05-12 DIAGNOSIS — F411 Generalized anxiety disorder: Secondary | ICD-10-CM

## 2011-05-12 DIAGNOSIS — IMO0002 Reserved for concepts with insufficient information to code with codable children: Secondary | ICD-10-CM | POA: Diagnosis present

## 2011-05-12 DIAGNOSIS — K2100 Gastro-esophageal reflux disease with esophagitis, without bleeding: Secondary | ICD-10-CM

## 2011-05-12 DIAGNOSIS — K219 Gastro-esophageal reflux disease without esophagitis: Secondary | ICD-10-CM | POA: Diagnosis present

## 2011-05-12 DIAGNOSIS — F3289 Other specified depressive episodes: Secondary | ICD-10-CM | POA: Diagnosis present

## 2011-05-12 HISTORY — PX: GASTRIC ROUX-EN-Y: SHX5262

## 2011-05-12 LAB — CBC
HCT: 36.5 % (ref 36.0–46.0)
Hemoglobin: 11.6 g/dL — ABNORMAL LOW (ref 12.0–15.0)
MCH: 28 pg (ref 26.0–34.0)
MCHC: 31.8 g/dL (ref 30.0–36.0)
MCV: 88.2 fL (ref 78.0–100.0)
Platelets: 363 10*3/uL (ref 150–400)
RBC: 4.14 MIL/uL (ref 3.87–5.11)
RDW: 14.2 % (ref 11.5–15.5)
WBC: 13.5 10*3/uL — ABNORMAL HIGH (ref 4.0–10.5)

## 2011-05-12 LAB — CREATININE, SERUM
Creatinine, Ser: 0.79 mg/dL (ref 0.50–1.10)
GFR calc Af Amer: >90 mL/min (ref >90)
GFR calc non Af Amer: >90 mL/min (ref >90)

## 2011-05-12 SURGERY — LAPAROSCOPIC ROUX-EN-Y GASTRIC
Anesthesia: General | Wound class: Clean Contaminated

## 2011-05-12 MED ORDER — HYDROMORPHONE HCL PF 1 MG/ML IJ SOLN: 0.2500 mg | INTRAMUSCULAR | Status: DC | PRN

## 2011-05-12 MED ORDER — ACETAMINOPHEN 10 MG/ML IV SOLN
INTRAVENOUS | Status: DC | PRN
  Administered 2011-05-12: 1000 mg via INTRAVENOUS

## 2011-05-12 MED ORDER — SCOPOLAMINE 1 MG/3DAYS TD PT72
1.0000 | MEDICATED_PATCH | TRANSDERMAL | Status: DC
  Administered 2011-05-12: 1.5 mg via TRANSDERMAL
  Filled 2011-05-12: qty 1

## 2011-05-12 MED ORDER — FIBRIN SEALANT COMPONENT 5 ML EX KIT
PACK | CUTANEOUS | Status: AC
  Filled 2011-05-12: qty 4

## 2011-05-12 MED ORDER — SUFENTANIL CITRATE 50 MCG/ML IV SOLN
INTRAVENOUS | Status: DC | PRN
  Administered 2011-05-12: 10 ug via INTRAVENOUS
  Administered 2011-05-12 (×2): 5 ug via INTRAVENOUS
  Administered 2011-05-12: 15 ug via INTRAVENOUS
  Administered 2011-05-12 (×3): 10 ug via INTRAVENOUS
  Administered 2011-05-12: 5 ug via INTRAVENOUS
  Administered 2011-05-12 (×2): 10 ug via INTRAVENOUS

## 2011-05-12 MED ORDER — LIDOCAINE HCL 4 % MT SOLN
OROMUCOSAL | Status: DC | PRN
  Administered 2011-05-12: 4 mL via TOPICAL

## 2011-05-12 MED ORDER — PROPOFOL 10 MG/ML IV EMUL
INTRAVENOUS | Status: DC | PRN
  Administered 2011-05-12: 170 mg via INTRAVENOUS

## 2011-05-12 MED ORDER — 0.9 % SODIUM CHLORIDE (POUR BTL) OPTIME
TOPICAL | Status: DC | PRN
  Administered 2011-05-12: 1000 mL

## 2011-05-12 MED ORDER — ACETAMINOPHEN 10 MG/ML IV SOLN
INTRAVENOUS | Status: AC
  Filled 2011-05-12: qty 100

## 2011-05-12 MED ORDER — SUCCINYLCHOLINE CHLORIDE 20 MG/ML IJ SOLN
INTRAMUSCULAR | Status: DC | PRN
  Administered 2011-05-12: 100 mg via INTRAVENOUS

## 2011-05-12 MED ORDER — PROMETHAZINE HCL 25 MG/ML IJ SOLN: 6.2500 mg | INTRAMUSCULAR | Status: DC | PRN

## 2011-05-12 MED ORDER — BUPIVACAINE LIPOSOME 1.3 % IJ SUSP
20.0000 mL | Freq: Once | INTRAMUSCULAR | Status: DC
  Filled 2011-05-12: qty 20

## 2011-05-12 MED ORDER — LIDOCAINE HCL (CARDIAC) 20 MG/ML IV SOLN
INTRAVENOUS | Status: DC | PRN
  Administered 2011-05-12: 50 mg via INTRAVENOUS

## 2011-05-12 MED ORDER — EPHEDRINE SULFATE 50 MG/ML IJ SOLN
INTRAMUSCULAR | Status: DC | PRN
  Administered 2011-05-12 (×2): 10 mg via INTRAVENOUS
  Administered 2011-05-12: 5 mg via INTRAVENOUS

## 2011-05-12 MED ORDER — ACETAMINOPHEN 160 MG/5ML PO SOLN
650.0000 mg | ORAL | Status: DC | PRN
  Filled 2011-05-12: qty 20.3

## 2011-05-12 MED ORDER — ONDANSETRON HCL 4 MG/2ML IJ SOLN
INTRAMUSCULAR | Status: DC | PRN
  Administered 2011-05-12: 4 mg via INTRAVENOUS

## 2011-05-12 MED ORDER — DEXAMETHASONE SODIUM PHOSPHATE 10 MG/ML IJ SOLN
INTRAMUSCULAR | Status: DC | PRN
  Administered 2011-05-12: 10 mg via INTRAVENOUS

## 2011-05-12 MED ORDER — DROPERIDOL 2.5 MG/ML IJ SOLN
INTRAMUSCULAR | Status: DC | PRN
  Administered 2011-05-12: .625 mg via INTRAVENOUS

## 2011-05-12 MED ORDER — CEFOXITIN SODIUM-DEXTROSE 1-4 GM-% IV SOLR (PREMIX)
INTRAVENOUS | Status: AC
  Filled 2011-05-12: qty 100

## 2011-05-12 MED ORDER — BUPIVACAINE LIPOSOME 1.3 % IJ SUSP
INTRAMUSCULAR | Status: DC | PRN
  Administered 2011-05-12: 20 mL

## 2011-05-12 MED ORDER — CEFOXITIN SODIUM 2 G IV SOLR
2.0000 g | INTRAVENOUS | Status: AC
  Administered 2011-05-12: 2 g via INTRAVENOUS
  Filled 2011-05-12: qty 2

## 2011-05-12 MED ORDER — UNJURY VANILLA POWDER
2.0000 [oz_av] | Freq: Four times a day (QID) | ORAL | Status: DC
  Administered 2011-05-14 – 2011-05-15 (×2): 2 [oz_av] via ORAL

## 2011-05-12 MED ORDER — UNJURY CHICKEN SOUP POWDER
2.0000 [oz_av] | Freq: Four times a day (QID) | ORAL | Status: DC
  Administered 2011-05-14: 2 [oz_av] via ORAL

## 2011-05-12 MED ORDER — ROCURONIUM BROMIDE 100 MG/10ML IV SOLN
INTRAVENOUS | Status: DC | PRN
  Administered 2011-05-12 (×3): 10 mg via INTRAVENOUS
  Administered 2011-05-12: 5 mg via INTRAVENOUS
  Administered 2011-05-12 (×2): 10 mg via INTRAVENOUS
  Administered 2011-05-12: 45 mg via INTRAVENOUS

## 2011-05-12 MED ORDER — HEPARIN SODIUM (PORCINE) 5000 UNIT/ML IJ SOLN
INTRAMUSCULAR | Status: AC
  Administered 2011-05-12: 5000 [IU] via SUBCUTANEOUS
  Filled 2011-05-12: qty 1

## 2011-05-12 MED ORDER — HEPARIN SODIUM (PORCINE) 5000 UNIT/ML IJ SOLN
5000.0000 [IU] | Freq: Three times a day (TID) | INTRAMUSCULAR | Status: DC
  Administered 2011-05-12 – 2011-05-15 (×9): 5000 [IU] via SUBCUTANEOUS
  Filled 2011-05-12 (×14): qty 1

## 2011-05-12 MED ORDER — LACTATED RINGERS IV SOLN
INTRAVENOUS | Status: DC | PRN
  Administered 2011-05-12 (×5): via INTRAVENOUS

## 2011-05-12 MED ORDER — TISSEEL VH 10 ML EX KIT
PACK | CUTANEOUS | Status: DC | PRN
  Administered 2011-05-12: 10 mL

## 2011-05-12 MED ORDER — UNJURY CHOCOLATE CLASSIC POWDER
2.0000 [oz_av] | Freq: Four times a day (QID) | ORAL | Status: DC
  Administered 2011-05-14: 2 [oz_av] via ORAL

## 2011-05-12 MED ORDER — MORPHINE SULFATE 2 MG/ML IJ SOLN
2.0000 mg | INTRAMUSCULAR | Status: DC | PRN
  Administered 2011-05-12: 4 mg via INTRAVENOUS
  Administered 2011-05-12: 2 mg via INTRAVENOUS
  Administered 2011-05-12 (×2): 4 mg via INTRAVENOUS
  Filled 2011-05-12 (×2): qty 2
  Filled 2011-05-12: qty 1
  Filled 2011-05-12: qty 2

## 2011-05-12 MED ORDER — HEPARIN SODIUM (PORCINE) 5000 UNIT/ML IJ SOLN
5000.0000 [IU] | INTRAMUSCULAR | Status: AC
  Administered 2011-05-12: 5000 [IU] via SUBCUTANEOUS

## 2011-05-12 MED ORDER — OXYCODONE-ACETAMINOPHEN 5-325 MG/5ML PO SOLN: 5.0000 mL | ORAL | Status: DC | PRN

## 2011-05-12 MED ORDER — NEOSTIGMINE METHYLSULFATE 1 MG/ML IJ SOLN
INTRAMUSCULAR | Status: DC | PRN
  Administered 2011-05-12: 5 mg via INTRAVENOUS

## 2011-05-12 MED ORDER — LACTATED RINGERS IR SOLN
Status: DC | PRN
  Administered 2011-05-12: 3000 mL

## 2011-05-12 MED ORDER — KCL IN DEXTROSE-NACL 20-5-0.45 MEQ/L-%-% IV SOLN
INTRAVENOUS | Status: DC
  Administered 2011-05-12 – 2011-05-14 (×6): via INTRAVENOUS
  Filled 2011-05-12 (×10): qty 1000

## 2011-05-12 MED ORDER — GLYCOPYRROLATE 0.2 MG/ML IJ SOLN
INTRAMUSCULAR | Status: DC | PRN
  Administered 2011-05-12: 0.6 mg via INTRAVENOUS

## 2011-05-12 MED ORDER — METOCLOPRAMIDE HCL 5 MG/ML IJ SOLN
INTRAMUSCULAR | Status: DC | PRN
  Administered 2011-05-12: 10 mg via INTRAVENOUS

## 2011-05-12 MED ORDER — ONDANSETRON HCL 4 MG/2ML IJ SOLN
4.0000 mg | INTRAMUSCULAR | Status: DC | PRN
  Administered 2011-05-12 – 2011-05-15 (×4): 4 mg via INTRAVENOUS
  Filled 2011-05-12 (×4): qty 2

## 2011-05-12 MED ORDER — PHENYLEPHRINE HCL 10 MG/ML IJ SOLN
INTRAMUSCULAR | Status: DC | PRN
  Administered 2011-05-12 (×2): 80 ug via INTRAVENOUS
  Administered 2011-05-12: 160 ug via INTRAVENOUS

## 2011-05-12 MED ORDER — SCOPOLAMINE 1 MG/3DAYS TD PT72
MEDICATED_PATCH | TRANSDERMAL | Status: AC
  Filled 2011-05-12: qty 1

## 2011-05-12 MED ORDER — LACTATED RINGERS IV SOLN: INTRAVENOUS | Status: DC

## 2011-05-12 MED ORDER — MIDAZOLAM HCL 5 MG/5ML IJ SOLN
INTRAMUSCULAR | Status: DC | PRN
  Administered 2011-05-12: 2 mg via INTRAVENOUS

## 2011-05-12 MED ORDER — SCOPOLAMINE 1 MG/3DAYS TD PT72
MEDICATED_PATCH | TRANSDERMAL | Status: DC | PRN
  Administered 2011-05-12: 1 via TRANSDERMAL

## 2011-05-12 MED ORDER — PROPOFOL 10 MG/ML IV EMUL
INTRAVENOUS | Status: DC | PRN
  Administered 2011-05-12: 75 ug/kg/min via INTRAVENOUS

## 2011-05-12 MED ORDER — BISOPROLOL FUMARATE 5 MG PO TABS
5.0000 mg | ORAL_TABLET | Freq: Once | ORAL | Status: AC
  Administered 2011-05-12: 5 mg via ORAL
  Filled 2011-05-12: qty 1

## 2011-05-12 SURGICAL SUPPLY — 74 items
ADH SKN CLS APL DERMABOND .7 (GAUZE/BANDAGES/DRESSINGS) ×2
APL SKNCLS STERI-STRIP NONHPOA (GAUZE/BANDAGES/DRESSINGS)
APL SRG 32X5 SNPLK LF DISP (MISCELLANEOUS) ×2
APPLICATOR COTTON TIP 6IN STRL (MISCELLANEOUS) ×5 IMPLANT
APPLIER CLIP ROT 13.4 12 LRG (CLIP) ×3
APR CLP LRG 13.4X12 ROT 20 MLT (CLIP) ×2
BENZOIN TINCTURE PRP APPL 2/3 (GAUZE/BANDAGES/DRESSINGS) IMPLANT
BLADE SURG 15 STRL LF DISP TIS (BLADE) ×2 IMPLANT
BLADE SURG 15 STRL SS (BLADE) ×3
CABLE HIGH FREQUENCY MONO STRZ (ELECTRODE) ×1 IMPLANT
CANISTER SUCTION 2500CC (MISCELLANEOUS) ×3 IMPLANT
CLIP APPLIE ROT 13.4 12 LRG (CLIP) IMPLANT
CLIP SUT LAPRA TY ABSORB (SUTURE) ×4 IMPLANT
CLOTH BEACON ORANGE TIMEOUT ST (SAFETY) ×3 IMPLANT
COVER SURGICAL LIGHT HANDLE (MISCELLANEOUS) ×3 IMPLANT
DERMABOND ADVANCED (GAUZE/BANDAGES/DRESSINGS) ×1
DERMABOND ADVANCED .7 DNX12 (GAUZE/BANDAGES/DRESSINGS) IMPLANT
DEVICE SUTURE ENDOST 10MM (ENDOMECHANICALS) ×3 IMPLANT
DISSECTOR BLUNT TIP ENDO 5MM (MISCELLANEOUS) ×3 IMPLANT
DRAIN PENROSE 18X1/4 LTX STRL (WOUND CARE) ×3 IMPLANT
DRAPE CAMERA CLOSED 9X96 (DRAPES) ×3 IMPLANT
GAUZE SPONGE 4X4 16PLY XRAY LF (GAUZE/BANDAGES/DRESSINGS) ×3 IMPLANT
GLOVE BIOGEL M 8.0 STRL (GLOVE) ×3 IMPLANT
GOWN STRL NON-REIN LRG LVL3 (GOWN DISPOSABLE) ×3 IMPLANT
GOWN STRL REIN XL XLG (GOWN DISPOSABLE) ×7 IMPLANT
HANDLE STAPLE EGIA 4 XL (STAPLE) ×3 IMPLANT
HOVERMATT SINGLE USE (MISCELLANEOUS) ×3 IMPLANT
KIT BASIN OR (CUSTOM PROCEDURE TRAY) ×3 IMPLANT
KIT GASTRIC LAVAGE 34FR ADT (SET/KITS/TRAYS/PACK) ×3 IMPLANT
NDL SPNL 22GX3.5 QUINCKE BK (NEEDLE) ×2 IMPLANT
NEEDLE SPNL 22GX3.5 QUINCKE BK (NEEDLE) ×3 IMPLANT
NS IRRIG 1000ML POUR BTL (IV SOLUTION) ×3 IMPLANT
PACK CARDIOVASCULAR III (CUSTOM PROCEDURE TRAY) ×3 IMPLANT
PEN SKIN MARKING BROAD (MISCELLANEOUS) ×3 IMPLANT
RELOAD EGIA 45 MED/THCK PURPLE (STAPLE) ×3 IMPLANT
RELOAD EGIA 45 TAN VASC (STAPLE) ×6 IMPLANT
RELOAD EGIA 60 MED/THCK PURPLE (STAPLE) ×9 IMPLANT
RELOAD EGIA 60 TAN VASC (STAPLE) ×3 IMPLANT
RELOAD ENDO STITCH 2.0 (ENDOMECHANICALS) ×30
RELOAD STAPLE 60 MED/THCK ART (STAPLE) ×4 IMPLANT
RELOAD SUT SNGL STCH ABSRB 2-0 (ENDOMECHANICALS) ×10 IMPLANT
RELOAD SUT SNGL STCH BLK 2-0 (ENDOMECHANICALS) ×8 IMPLANT
SCISSORS LAP 5X35 DISP (ENDOMECHANICALS) ×3 IMPLANT
SEALANT SURGICAL APPL DUAL CAN (MISCELLANEOUS) ×3 IMPLANT
SET IRRIG TUBING LAPAROSCOPIC (IRRIGATION / IRRIGATOR) ×3 IMPLANT
SHEARS CURVED HARMONIC AC 45CM (MISCELLANEOUS) ×3 IMPLANT
SLEEVE ADV FIXATION 12X100MM (TROCAR) ×6 IMPLANT
SLEEVE ADV FIXATION 5X100MM (TROCAR) ×3 IMPLANT
SLEEVE Z-THREAD 12X100MM (TROCAR) IMPLANT
SLEEVE Z-THREAD 5X100MM (TROCAR) IMPLANT
SOLUTION ANTI FOG 6CC (MISCELLANEOUS) ×3 IMPLANT
SPONGE GAUZE 4X4 12PLY (GAUZE/BANDAGES/DRESSINGS) ×2 IMPLANT
STAPLER VISISTAT 35W (STAPLE) ×3 IMPLANT
STRIP CLOSURE SKIN 1/2X4 (GAUZE/BANDAGES/DRESSINGS) IMPLANT
STRIP PERI DRY VERITAS 45 (STAPLE) ×2 IMPLANT
STRIP PERI DRY VERITAS 60 (STAPLE) ×1 IMPLANT
SUT RELOAD ENDO STITCH 2 48X1 (ENDOMECHANICALS) ×10
SUT RELOAD ENDO STITCH 2.0 (ENDOMECHANICALS) ×10
SUT VIC AB 2-0 SH 27 (SUTURE) ×3
SUT VIC AB 2-0 SH 27X BRD (SUTURE) ×2 IMPLANT
SUT VIC AB 4-0 SH 18 (SUTURE) ×3 IMPLANT
SUTURE RELOAD END STTCH 2 48X1 (ENDOMECHANICALS) ×10 IMPLANT
SUTURE RELOAD ENDO STITCH 2.0 (ENDOMECHANICALS) ×10 IMPLANT
SYR 20CC LL (SYRINGE) ×3 IMPLANT
SYR 30ML LL (SYRINGE) ×3 IMPLANT
SYR 50ML LL SCALE MARK (SYRINGE) ×3 IMPLANT
TRAY FOLEY CATH 14FRSI W/METER (CATHETERS) ×3 IMPLANT
TROCAR ADV FIXATION 12X100MM (TROCAR) ×3 IMPLANT
TROCAR XCEL 12X100 BLDLESS (ENDOMECHANICALS) ×3 IMPLANT
TROCAR Z-THREAD FIOS 12X100MM (TROCAR) IMPLANT
TROCAR Z-THREAD FIOS 5X100MM (TROCAR) ×3 IMPLANT
TUBING CONNECTING 10 (TUBING) ×3 IMPLANT
TUBING ENDO SMARTCAP (MISCELLANEOUS) ×3 IMPLANT
TUBING FILTER THERMOFLATOR (ELECTROSURGICAL) ×3 IMPLANT

## 2011-05-12 NOTE — Transfer of Care (Signed)
Immediate Anesthesia Transfer of Care Note  Patient: Shalanda Brogden [facility]  Procedure(s) Performed: Procedure(s) (LRB): LAPAROSCOPIC ROUX-EN-Y GASTRIC (N/A) UPPER GI ENDOSCOPY ()  Patient Location: PACU  Anesthesia Type: General  Level of Consciousness: awake, alert , oriented, patient cooperative and responds to stimulation  Airway & Oxygen Therapy: Patient Spontanous Breathing and Patient connected to face mask oxygen  Post-op Assessment: Report given to PACU RN, Post -op Vital signs reviewed and stable and Patient moving all extremities  Post vital signs: stable  Complications: No apparent anesthesia complications

## 2011-05-12 NOTE — Anesthesia Postprocedure Evaluation (Signed)
  Anesthesia Post-op Note  Patient: Tiffany Bean [facility]  Procedure(s) Performed: Procedure(s) (LRB): LAPAROSCOPIC ROUX-EN-Y GASTRIC (N/A) UPPER GI ENDOSCOPY ()  Patient Location: PACU  Anesthesia Type: General  Level of Consciousness: awake and alert   Airway and Oxygen Therapy: Patient Spontanous Breathing  Post-op Pain: mild  Post-op Assessment: Post-op Vital signs reviewed, Patient's Cardiovascular Status Stable, Respiratory Function Stable, Patent Airway and No signs of Nausea or vomiting  Post-op Vital Signs: stable  Complications: No apparent anesthesia complications

## 2011-05-12 NOTE — H&P (Addendum)
Chief Complaint: Morbid obesity BMI 44  History of Present Illness: Tiffany Bean is an 51 y.o. female [employer] employee who is interested in gastric bypass surgery. 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 Since she gained 60 pounds with her first child she has been unsuccessful in managing her obesity successfully. Last year she lost almost 50 pounds and doing a non-eating diet but has regained. She wants to proceed with workup for laparoscopic Roux-en-Y gastric bypass.  Past Medical History   Diagnosis  Date   .  Hypertension    .  Hyperlipidemia    .  Anxiety    .  Depression    .  GERD (gastroesophageal reflux disease)    .  Degenerative disc disease     Past Surgical History   Procedure  Date   .  Cholecystectomy  1995?   Marland Kitchen  Abdominal hysterectomy  1986   .  Incontinence surgery  1996    Current Outpatient Prescriptions   Medication  Sig  Dispense  Refill   .  bisoprolol-hydrochlorothiazide (ZIAC) 5-6.25 MG per tablet  daily.     Marland Kitchen  buPROPion (WELLBUTRIN XL) 300 MG 24 hr tablet  Take 300 mg by mouth daily.     Marland Kitchen  Dexlansoprazole (DEXILANT PO)  Take 50 mg by mouth 1 day or 1 dose.     .  DULoxetine (CYMBALTA) 60 MG capsule  Take 60 mg by mouth daily.     .  phentermine (ADIPEX-P) 37.5 MG tablet  Take 37.5 mg by mouth daily before breakfast.     .  simvastatin (ZOCOR) 40 MG tablet  Take 40 mg by mouth every evening.     .  valACYclovir (VALTREX) 500 MG tablet  Ad lib.     Codeine  Family History   Problem  Relation  Age of Onset   .  Heart disease  Father    .  COPD  Father    Social History: reports that she has never smoked. She does not have any smokeless tobacco history on file. She reports that she does not drink alcohol or use illicit drugs.  REVIEW OF SYSTEMS - PERTINENT POSITIVES ONLY:  Noncontributory.  Physical Exam:  Blood pressure 124/86, pulse 72, temperature 98.6 F (37 C),  temperature source Temporal, resp. rate 18, height 5\' 2"  (1.575 m), weight 240 lb 9.6 oz (109.135 kg).  Body mass index is 44.01 kg/(m^2).  Gen: WDWN white female NAD  Neurological: Alert and oriented to person, place, and time. Motor and sensory function is grossly intact  Head: Normocephalic and atraumatic.  Eyes: Conjunctivae are normal. Pupils are equal, round, and reactive to light. No scleral icterus.  Neck: Normal range of motion. Neck supple. No tracheal deviation or thyromegaly present.  Cardiovascular: SR without murmurs or gallops. No carotid bruits  Respiratory: Effort normal. No respiratory distress. No chest wall tenderness. Breath sounds normal. No wheezes, rales or rhonchi.  Abdomen: Non tender  GU:  Musculoskeletal: Normal range of motion. Extremities are nontender. No cyanosis, edema or clubbing noted Lymphadenopathy: No cervical, preauricular, postauricular or axillary adenopathy is present Skin: Skin is warm and dry. No rash noted. No diaphoresis. No erythema. No pallor. Pscyh: Normal mood and affect. Behavior is normal. Judgment and thought content normal.  LABORATORY RESULTS:   RADIOLOGY RESULTS:  No results found. Problem List:  Patient Active Problem List  Diagnoses   .  DEPRESSION, MAJOR, RECURRENT   .  ANXIETY   .  IMPOTENCE INORGANIC   .  HYPERTENSION, BENIGN SYSTEMIC   .  HYPERTENSION   .  GASTROESOPHAGEAL REFLUX, NO ESOPHAGITIS   .  MENOPAUSAL SYNDROME   .  BACK PAIN, LOW   .  INSOMNIA NOS   .  Obesity   BMI 44, no history of DVT but with essential hypertension. Followed by Dr. Gerlene Burdock Tisovic  UGI showed small hiatus hernia.  Plan: Lap roux en y gastric bypass  Informed consent forms on [employer] paper chart.     Matt B. Daphine Deutscher, MD, [facility]  [phone] beeper  [phone]  There has been no change in the patient's past medical history or physical exam in the past 24 hours to the best of my knowledge. I examined the  patient in the holding area and have made any changes to the history and physical exam report that is included above.   Expectations and outcome results have been discussed with the patient to include risks and benefits.  All questions have been answered and we will proceed with previously discussed procedure noted and signed in the consent form in the patient's record.    Acsa Estey BMD FACS 7:22 AM  05/12/2011

## 2011-05-12 NOTE — Op Note (Signed)
Surgeon: Pollyann Savoy. Daphine Deutscher, MD, FACS Asst:  Jaclynn Guarneri, M.D., [facility] Anesthesia: General endotracheal Drains: None  Procedure: Laparoscopic Roux en Y gastric bypass with 40 cm BP limb and 100 cm Roux limb, antecolic, antegastric, candy cane to the left.  Closure of Peterson's defect. Upper endoscopy.   Description of Procedure:  The patient was taken to OR 1 at [facility] and given general anesthesia.  The abdomen was prepped with PCMX and draped sterilely.  A time out was performed.    The operation began by identifying the ligament of Treitz. I measured 40 cm downstream and divided the bowel with a 6 cm Covidian stapler.  I sutured a Penrose drain along the Roux limb end.  I measured a 1 meter (100 cm) Roux limb and then placed the distal bowels to the BP limb side by side and performed a stapled jejunojejunostomy. The common defect was closed from either end with 4-0 Vicryl using the Endo Stitch. The mesenteric defect was closed with a running 2-0 silk using the Endo Stitch. Tisseel was applied to the suture line.  The omentum was divided with the harmonic scalpel.  The Nathanson retractor was inserted in the left lateral segment of liver was retracted. The foregut dissection ensued.  There was no visible dimple or evidence of a hiatal hernia. We measured for to 5 cm along the lesser curve and entered the retrogastric space. The pouch was then created with multiple firings of the Covidien stapler using purple cartridges and the 6 mm variety. The initial 2 firings were without peri-strips and the remainder of the pouch was created with Peri-Strips. Bleeding was controlled and there were some clips placed up on the remnant where a bleeder was noted.  The Roux limb was then brought up with the candycane pointed left and a back row of sutures of 2-0 Vicryl were placed. I opened along the right side of each structure and inserted the 4.5 cm stapler to create the gastrojejunostomy. The common defect was closed from  either end with 2-0 Vicryl and a second row was placed anterior to that the Ewald tube acting as a stent across the anastomosis. The Penrose drain was removed. Peterson's defect was closed with 2-0 silk.   Endoscopy was performed by Dr. Johna Sheriff which showed no evidence of bubbles or leaks and a pouch that was about 5 cm in length. No bleeding was noted.  The incisions were injected with Exparel and were closed with 4-0 Vicryl and Dermabond  The patient was taken to the recovery room in satisfactory condition.  Matt B. Daphine Deutscher, MD, FACS

## 2011-05-12 NOTE — Op Note (Signed)
Upper GI endoscopy is performed at the completion of laparoscopic Roux-en-Y gastric bypass by Dr. Daphine Deutscher. The Olympus video endoscope was inserted into the upper esophagus and then passed under direct vision to the EG junction. The small gastric pouch was insufflated with air while the gastric outlet was clamped under irrigation by the operating surgeon. There was no evidence of leak. The anastomosis was visualized and was patent. Suture and staple lines were intact and without bleeding. The pouch was tubular and measured 4-5 cm in length. At the completion of the procedure the pouch was desufflated and the scope withdrawn.   Mariella Saa MD, FACS  05/12/2011, 10:53 AM

## 2011-05-12 NOTE — Anesthesia Preprocedure Evaluation (Addendum)
Anesthesia Evaluation  Patient identified by MRN, date of birth, ID band Patient awake    Reviewed: Allergy & Precautions, H&P , NPO status , Patient's Chart, lab work & pertinent test results  History of Anesthesia Complications (+) PONV  Airway Mallampati: II TM Distance: >3 FB Neck ROM: Full    Dental No notable dental hx.    Pulmonary neg pulmonary ROS,  breath sounds clear to auscultation  Pulmonary exam normal       Cardiovascular hypertension, Pt. on home beta blockers Rhythm:Regular Rate:Normal     Neuro/Psych PSYCHIATRIC DISORDERS Anxiety Depression negative neurological ROS     GI/Hepatic Neg liver ROS, GERD-  Medicated,  Endo/Other  negative endocrine ROS  Renal/GU negative Renal ROS  negative genitourinary   Musculoskeletal negative musculoskeletal ROS (+)   Abdominal   Peds negative pediatric ROS (+)  Hematology negative hematology ROS (+)   Anesthesia Other Findings   Reproductive/Obstetrics negative OB ROS                           Anesthesia Physical Anesthesia Plan  ASA: II  Anesthesia Plan: General   Post-op Pain Management:    Induction: Intravenous  Airway Management Planned: Oral ETT  Additional Equipment:   Intra-op Plan:   Post-operative Plan: Extubation in OR  Informed Consent: I have reviewed the patients History and Physical, chart, labs and discussed the procedure including the risks, benefits and alternatives for the proposed anesthesia with the patient or authorized representative who has indicated his/her understanding and acceptance.   Dental advisory given  Plan Discussed with: CRNA  Anesthesia Plan Comments: (CXR normal. ECG with NSR and cannot rule out anterior infarct.  Plan scopolamine patch.)       Anesthesia Quick Evaluation

## 2011-05-13 ENCOUNTER — Inpatient Hospital Stay (HOSPITAL_COMMUNITY): Payer: BC Managed Care – PPO

## 2011-05-13 DIAGNOSIS — Z9889 Other specified postprocedural states: Secondary | ICD-10-CM

## 2011-05-13 LAB — CBC
HCT: 38.7 % (ref 36.0–46.0)
Hemoglobin: 12.2 g/dL (ref 12.0–15.0)
MCH: 28.5 pg (ref 26.0–34.0)
MCHC: 31.5 g/dL (ref 30.0–36.0)
MCV: 90.4 fL (ref 78.0–100.0)
Platelets: 416 10*3/uL — ABNORMAL HIGH (ref 150–400)
RBC: 4.28 MIL/uL (ref 3.87–5.11)
RDW: 14.2 % (ref 11.5–15.5)
WBC: 16.9 10*3/uL — ABNORMAL HIGH (ref 4.0–10.5)

## 2011-05-13 LAB — DIFFERENTIAL
Basophils Absolute: 0 10*3/uL (ref 0.0–0.1)
Basophils Relative: 0 % (ref 0–1)
Eosinophils Absolute: 0 10*3/uL (ref 0.0–0.7)
Eosinophils Relative: 0 % (ref 0–5)
Lymphocytes Relative: 9 % — ABNORMAL LOW (ref 12–46)
Lymphs Abs: 1.5 10*3/uL (ref 0.7–4.0)
Monocytes Absolute: 0.9 10*3/uL (ref 0.1–1.0)
Monocytes Relative: 5 % (ref 3–12)
Neutro Abs: 14.5 10*3/uL — ABNORMAL HIGH (ref 1.7–7.7)
Neutrophils Relative %: 86 % — ABNORMAL HIGH (ref 43–77)

## 2011-05-13 LAB — HEMOGLOBIN AND HEMATOCRIT, BLOOD
HCT: 37.2 % (ref 36.0–46.0)
Hemoglobin: 12 g/dL (ref 12.0–15.0)

## 2011-05-13 MED ORDER — HYDROMORPHONE HCL PF 1 MG/ML IJ SOLN
0.5000 mg | INTRAMUSCULAR | Status: DC | PRN
  Administered 2011-05-13 – 2011-05-15 (×6): 0.5 mg via INTRAVENOUS
  Filled 2011-05-13 (×6): qty 1

## 2011-05-13 MED ORDER — HYDROCODONE-ACETAMINOPHEN 7.5-500 MG/15ML PO SOLN
5.0000 mL | ORAL | Status: DC | PRN
  Administered 2011-05-13: 5 mL via ORAL
  Filled 2011-05-13 (×2): qty 15

## 2011-05-13 MED ORDER — IOHEXOL 300 MG/ML  SOLN
40.0000 mL | Freq: Once | INTRAMUSCULAR | Status: AC | PRN
Start: 2011-05-13 — End: 2011-05-13
  Administered 2011-05-13: 40 mL via ORAL

## 2011-05-13 MED ORDER — ACETAMINOPHEN 10 MG/ML IV SOLN
1000.0000 mg | Freq: Four times a day (QID) | INTRAVENOUS | Status: DC
  Administered 2011-05-13 (×3): 1000 mg via INTRAVENOUS
  Filled 2011-05-13 (×5): qty 100

## 2011-05-13 NOTE — Progress Notes (Signed)
Notified MD regarding patients allergy to codeine and oxycodone, states it makes her delirious, MD ordered lortab 5 cc q4h prn Means, Fareeda Downard N 05-13-11 16:00pm

## 2011-05-13 NOTE — Progress Notes (Signed)
Dr. Daphine Deutscher notified of UGI results; orders received to advance to POD #1 diet; Kennitrish, RN aware of results and orders. Talmadge Chad, RN Bariatric Nurse Coordinator

## 2011-05-13 NOTE — Progress Notes (Signed)
Patient ID: Tiffany Bean, female   DOB: [birth year], 51 y.o.   MRN: [id number] [facility] Progress Note:   1 Day Post-Op  Subjective: Mental status is clear Objective: Vital signs in last 24 hours: Temp:  [97.3 F (36.3 C)-100.3 F (37.9 C)] 98.9 F (37.2 C) (04/02 1000) Pulse Rate:  [56-84] 80  (04/02 1000) Resp:  [14-20] 20  (04/02 1000) BP: (133-166)/(70-96) 153/77 mmHg (04/02 1000) SpO2:  [92 %-98 %] 98 % (04/02 1000)  Intake/Output from previous day: 04/01 0701 - 04/02 0700 In: 5400 [I.V.:5400] Out: 3980 [Urine:3930; Blood:50] Intake/Output this shift: Total I/O In: 0  Out: 1100 [Urine:1100]  Physical Exam: Work of breathing is  Normal.  Minimal abdominal pain  Lab Results:  Results for orders placed during the hospital encounter of 05/12/11 (from the past 48 hour(s))  CBC     Status: Abnormal   Collection Time   05/12/11  1:38 PM      Component Value Range Comment   WBC 13.5 (*) 4.0 - 10.5 (K/uL)    RBC 4.14  3.87 - 5.11 (MIL/uL)    Hemoglobin 11.6 (*) 12.0 - 15.0 (g/dL)    HCT 40.9  81.1 - 91.4 (%)    MCV 88.2  78.0 - 100.0 (fL)    MCH 28.0  26.0 - 34.0 (pg)    MCHC 31.8  30.0 - 36.0 (g/dL)    RDW 78.2  95.6 - 21.3 (%)    Platelets 363  150 - 400 (K/uL)   CREATININE, SERUM     Status: Normal   Collection Time   05/12/11  1:38 PM      Component Value Range Comment   Creatinine, Ser 0.79  0.50 - 1.10 (mg/dL)    GFR calc non Af Amer >90  >90 (mL/min)    GFR calc Af Amer >90  >90 (mL/min)   CBC     Status: Abnormal   Collection Time   05/13/11  4:08 AM      Component Value Range Comment   WBC 16.9 (*) 4.0 - 10.5 (K/uL)    RBC 4.28  3.87 - 5.11 (MIL/uL)    Hemoglobin 12.2  12.0 - 15.0 (g/dL)    HCT 08.6  57.8 - 46.9 (%)    MCV 90.4  78.0 - 100.0 (fL)    MCH 28.5  26.0 - 34.0 (pg)    MCHC 31.5  30.0 - 36.0 (g/dL)    RDW 62.9  52.8 - 41.3 (%)    Platelets 416 (*) 150 - 400 (K/uL)   DIFFERENTIAL     Status: Abnormal   Collection Time   05/13/11  4:08  AM      Component Value Range Comment   Neutrophils Relative 86 (*) 43 - 77 (%)    Neutro Abs 14.5 (*) 1.7 - 7.7 (K/uL)    Lymphocytes Relative 9 (*) 12 - 46 (%)    Lymphs Abs 1.5  0.7 - 4.0 (K/uL)    Monocytes Relative 5  3 - 12 (%)    Monocytes Absolute 0.9  0.1 - 1.0 (K/uL)    Eosinophils Relative 0  0 - 5 (%)    Eosinophils Absolute 0.0  0.0 - 0.7 (K/uL)    Basophils Relative 0  0 - 1 (%)    Basophils Absolute 0.0  0.0 - 0.1 (K/uL)     Radiology/Results: Dg Ugi W/water Sol Cm  05/13/2011  *RADIOLOGY REPORT*  Clinical Data:  Recent gastric bypass surgery.  UPPER GI SERIES WITH KUB  Technique:  Routine upper GI series was performed with water- soluble contrast.  Fluoroscopy Time: 0.58 minutes  Comparison:  Upper GI dated 03/13/2011  Findings: The KUB demonstrates surgical clips and staples in the left upper quadrant from recent gastric bypass and surgical clips in the right upper quadrant from previous cholecystectomy.  Bowel gas pattern is normal.  The patient ingested 40 ml of water soluble contrast.  There was immediate passage of the contrast from the normal appearing esophagus into the gastric remnant and through the anastomosis into the jejunum.  No evidence of obstruction or extravasation.  IMPRESSION: No evidence of leak or obstruction after gastric bypass.  Original Report Authenticated By: Gwynn Burly, M.D.    Anti-infectives: Anti-infectives     Start     Dose/Rate Route Frequency Ordered Stop   05/12/11 0600   cefOXitin (MEFOXIN) 2 g in dextrose 5 % 50 mL IVPB        2 g 100 mL/hr over 30 Minutes Intravenous 60 min pre-op 05/12/11 0528 05/12/11 0725          Assessment/Plan: Problem List: Patient Active Problem List  Diagnoses  . DEPRESSION, MAJOR, RECURRENT  . ANXIETY  . IMPOTENCE INORGANIC  . HYPERTENSION, BENIGN SYSTEMIC  . HYPERTENSION  . GASTROESOPHAGEAL REFLUX, NO ESOPHAGITIS  . MENOPAUSAL SYNDROME  . BACK PAIN, LOW  . INSOMNIA NOS  . Obesity     Swallow OK.  DVT study OK.  Will begin PD #1 diet.  1 Day Post-Op    LOS: 1 day   Matt B. Daphine Deutscher, MD, [facility] [phone] beeper [phone]  05/13/2011 12:10 PM

## 2011-05-13 NOTE — Progress Notes (Signed)
Pt c/o headache pain at a level of 8.  Notified MD on call, obtained order for IV Tylenol and d/c Morphine, ordered Dilaudid prn for pain.

## 2011-05-13 NOTE — Progress Notes (Signed)
*  PRELIMINARY RESULTS* Vascular Ultrasound Lower extremity venous duplex has been completed.  Preliminary findings: Bilaterally no evidence of DVT. Left baker's cyst.  Farrel Demark RDMS 05/13/2011, 8:30 AM

## 2011-05-13 NOTE — Progress Notes (Signed)
Pt alert and oriented; c/o headache; denies any nausea or vomiting; denies flatus or BM at this time; voiding without difficulty; ambulating in  Hallways without difficulty; c/o some abdominal soreness but has relief with prn pain meds; remains NPO; awaiting UGI; Doppler study negative; discharge instructions given for pt to review and will complete tomorrow when pt is feeling better; continue current plan of care. GASTRIC BYPASS/SLEEVE DISCHARGE INSTRUCTIONS  Drs. Fredrik Rigger, Hoxworth, Wilson, and Macdona Call if you have any problems.   Call [phone] and ask for the surgeon on call.    If you need immediate assistance come to the ER at [facility]. Tell the ER personnel that you are a new post-op gastric bypass patient. Signs and symptoms to report:   Severe vomiting or nausea. If you cannot tolerate clear liquids for longer than 1 day, you need to call your surgeon.    Abdominal pain which does not get better after taking your pain medication   Fever greater than 101 F degree   Difficulty breathing   Chest pain    Redness, swelling, drainage, or foul odor at incision sites    If your incisions open or pull apart   Swelling or pain in calf (lower leg)   Diarrhea, frequent watery, uncontrolled bowel movements.   Constipation, (no bowel movements for 3 days) if this occurs, Take Milk of Magnesia, 2 tablespoons by mouth, 3 times a day for 2 days if needed.  Call your doctor if constipation continues. Stop taking Milk of Magnesia once you have had a bowel movement. You may also use Miralax according to the label instructions.   Anything you consider "abnormal for you".   Normal side effects after Surgery:   Unable to sleep at night or concentrate   Irritability   Being tearful (crying) or depressed   These are common complaints, possibly related to your anesthesia, stress of surgery and change in lifestyle, that usually go away a few weeks after surgery.  If these feelings continue,  call your medical doctor.  Wound Care You may have surgical glue, steri-strips, or staples over your incisions after surgery.  Surgical glue:  Looks like a clear film over your incisions and will wear off gradually. Steri-strips: Strips of tape over your incisions. You may notice a yellowish color on the skin underneath the steri-strips. This is a substance used to make the steri-strips stick better. Do not pull the steri-strips off - let them fall off.  Staples: Cherlynn Polo may be removed before you leave the hospital. If you go home with staples, call [facility] [phone]) for an appointment with your surgeon's nurse to have staples removed in 7 - 10 days. Showering: You may shower two days after your surgery unless otherwise instructed by your surgeon. Wash gently around wounds with warm soapy water, rinse well, and gently pat dry.  If you have a drain, you may need someone to hold this while you shower. Avoid tub baths until staples are removed and incisions are healed.    Medications   Medications should be liquid or crushed if larger than the size of a dime.  Extended release pills should not be crushed.   Depending on the size and number of medications you take, you may need to stagger/change the time you take your medications so that you do not over-fill your pouch.    Make sure you follow-up with your primary care physician to make medication adjustments needed during rapid weight loss and life-style  adjustment.   If you are diabetic, follow up with the doctor that prescribes your diabetes medication(s) within one week after surgery and check your blood sugar regularly.   Do not drive while taking narcotics!   Do not take acetaminophen (Tylenol) and Roxicet or Lortab Elixir at the same time since these pain medications contain acetaminophen.  Diet at home: (First 2 Weeks) You will see the nutritionist two weeks after your surgery. She will advance your diet if you are  tolerating liquids well. Once at home, if you have severe vomiting or nausea and cannot tolerate clear liquids lasting longer than 1 day, call your surgeon.  Begin high protein shake 2 ounces every 3 hours, 5 - 6 times per day.  Gradually increase the amount you drink as tolerated.  You may find it easier to slowly sip shakes throughout the day.  It is important to get your proteins in first.   Protein Shake   Drink at least 2 ounces of shake 5-6 times per day   Each serving of protein shakes should have a minimum of 15 grams of protein and no more than 5 grams of carbohydrate    Increase the amount of protein shake you drink as tolerated   Protein powder may be added to fluids such as non-fat milk or Lactaid milk (limit to 20 grams added protein powder per serving   The initial goal is to drink at least 8 ounces of protein shake/drink per day (or as directed by the nutritionist). Some examples of protein shakes are [employer], [employer], EAS Edge HP, and [employer]jury. Hydration   Gradually increase the amount of water and other liquids as tolerated (See Acceptable Fluids)   Gradually increase the amount of protein shake as tolerated     Sip fluids slowly and throughout the day   May use Sugar substitutes, use sparingly (limit to 6 - 8 packets per day). Your fluid goal is 64 ounces of fluid daily. It may take a few weeks to build up to this.         32 oz (or more) should be clear liquids and 32 oz (or more) should be full liquids.         Liquids should not contain sugar, caffeine, or carbonation! Acceptable Fluids Clear Liquids:   Water or Sugar-free flavored water, Fruit H2O   Decaffeinated coffee or tea (sugar-free)   Crystal Lite, Wyler's Lite, Minute Maid Lite   Sugar-free Jell-O   Bouillon or broth   Sugar-free Popsicle:   *Less than 20 calories each; Limit 1 per day   Full Liquids:              Protein Shakes/Drinks + 2 choices per day of other full liquids shown below.     Other full liquids must be: No more than 12 grams of Carbs per serving,  No more than 3 grams of Fat per serving   Strained low-fat cream soup   Non-Fat milk   Fat-free Lactaid Milk   Sugar-free yogurt (Dannon Lite & Fit) Vitamins and Minerals (Start 1 day after surgery unless otherwise directed)   2 Chewable Multivitamin / Multimineral Supplement (i.e. Centrum for Adults)   Chewable Calcium Citrate with Vitamin D-3. Take 1500 mg each day.           (Example: 3 Chewable Calcium Plus 600 with Vitamin D-3 can be found at [facility])         Vitamin B-12, 350 - 500 micrograms (oral  tablet) each day   Do not mix multivitamins containing iron with calcium supplements; take 2 hours   apart   Do not substitute Tums (calcium carbonate) for your calcium   Menstruating women and those at risk for anemia may need extra iron. Talk with your doctor to see if you need additional iron.    If you need extra iron:  Total daily Iron recommendations (including Vitamins) = 50 - 100 mg Iron/day Do not stop taking or change any vitamins or minerals until you talk to your nutritionist or surgeon. Your nutritionist and / or physician must approve all vitamin and mineral supplements. Exercise For maximum success, begin exercising as soon as your doctor recommends. Make sure your physician approves any physical activity.   Depending on fitness level, begin with a simple walking program   Walk 5-15 minutes each day, 7 days per week.    Slowly increase until you are walking 30-45 minutes per day   Consider joining our BELT program. [phone] or email belt@[employer] .edu Things to remember:    You may have sexual relations when you feel comfortable. It is VERY important for female patients to use a reliable birth control method. Fertility often increases after surgery. Do not get pregnant for at least 18 months.   It is very important to keep all follow up appointments with your surgeon, nutritionist, primary care physician,  and behavioral health practitioner. After the first year, please follow up with your bariatric surgeon at least once a year in order to maintain best weight loss results.  [facility]: [phone] [facility]: [phone]   Free counseling is available for you and your family through collaboration between [facility] and [place]. Please call [phone] and leave a message.    Consider purchasing a medical alert bracelet that says you had gastric bypass surgery.    [facility] has a free Bariatric Surgery Support Group that meets monthly, the 3rd Thursday, 6 pm, Classroom #1, [place]. You may register online at [email or web address], but registration is not necessary. Select Classes and Support Groups, Bariatric Surgery, or Call [phone]   Do not return to work or drive until cleared by your surgeon   Use your CPAP when sleeping if applicable   Do not lift anything greater than ten pounds for at least two weeks  Talmadge Chad, RN Bariatric Nurse Coordinator

## 2011-05-14 LAB — CBC
HCT: 36 % (ref 36.0–46.0)
Hemoglobin: 11 g/dL — ABNORMAL LOW (ref 12.0–15.0)
MCH: 28.1 pg (ref 26.0–34.0)
MCHC: 30.6 g/dL (ref 30.0–36.0)
MCV: 92.1 fL (ref 78.0–100.0)
Platelets: 359 10*3/uL (ref 150–400)
RBC: 3.91 MIL/uL (ref 3.87–5.11)
RDW: 14.6 % (ref 11.5–15.5)
WBC: 13 10*3/uL — ABNORMAL HIGH (ref 4.0–10.5)

## 2011-05-14 LAB — DIFFERENTIAL
Basophils Absolute: 0 10*3/uL (ref 0.0–0.1)
Basophils Relative: 0 % (ref 0–1)
Eosinophils Absolute: 0.1 10*3/uL (ref 0.0–0.7)
Eosinophils Relative: 1 % (ref 0–5)
Lymphocytes Relative: 15 % (ref 12–46)
Lymphs Abs: 2 10*3/uL (ref 0.7–4.0)
Monocytes Absolute: 1.2 10*3/uL — ABNORMAL HIGH (ref 0.1–1.0)
Monocytes Relative: 9 % (ref 3–12)
Neutro Abs: 9.7 10*3/uL — ABNORMAL HIGH (ref 1.7–7.7)
Neutrophils Relative %: 75 % (ref 43–77)

## 2011-05-14 NOTE — Progress Notes (Signed)
Patient ID: Tiffany Bean, female   DOB: 1960-06-09, 51 y.o.   MRN: [id number] [facility] Progress Note:   2 Days Post-Op  Subjective: Mental status is clear Objective: Vital signs in last 24 hours: Temp:  [97.8 F (36.6 C)-98.9 F (37.2 C)] 97.8 F (36.6 C) (04/03 0614) Pulse Rate:  [61-87] 67  (04/03 0614) Resp:  [16-20] 16  (04/03 0614) BP: (108-162)/(57-82) 108/60 mmHg (04/03 0614) SpO2:  [96 %-99 %] 97 % (04/03 0614)  Intake/Output from previous day: 04/02 0701 - 04/03 0700 In: 1200 [I.V.:1200] Out: 2250 [Urine:2250] Intake/Output this shift:    Physical Exam: Work of breathing is  Normal.  Complaining of a headache.  Will offer coffee as this may be caffeine withdrawal  Lab Results:  Results for orders placed during the hospital encounter of 05/12/11 (from the past 48 hour(s))  CBC     Status: Abnormal   Collection Time   05/12/11  1:38 PM      Component Value Range Comment   WBC 13.5 (*) 4.0 - 10.5 (K/uL)    RBC 4.14  3.87 - 5.11 (MIL/uL)    Hemoglobin 11.6 (*) 12.0 - 15.0 (g/dL)    HCT 52.8  41.3 - 24.4 (%)    MCV 88.2  78.0 - 100.0 (fL)    MCH 28.0  26.0 - 34.0 (pg)    MCHC 31.8  30.0 - 36.0 (g/dL)    RDW 01.0  27.2 - 53.6 (%)    Platelets 363  150 - 400 (K/uL)   CREATININE, SERUM     Status: Normal   Collection Time   05/12/11  1:38 PM      Component Value Range Comment   Creatinine, Ser 0.79  0.50 - 1.10 (mg/dL)    GFR calc non Af Amer >90  >90 (mL/min)    GFR calc Af Amer >90  >90 (mL/min)   CBC     Status: Abnormal   Collection Time   05/13/11  4:08 AM      Component Value Range Comment   WBC 16.9 (*) 4.0 - 10.5 (K/uL)    RBC 4.28  3.87 - 5.11 (MIL/uL)    Hemoglobin 12.2  12.0 - 15.0 (g/dL)    HCT 64.4  03.4 - 74.2 (%)    MCV 90.4  78.0 - 100.0 (fL)    MCH 28.5  26.0 - 34.0 (pg)    MCHC 31.5  30.0 - 36.0 (g/dL)    RDW 59.5  63.8 - 75.6 (%)    Platelets 416 (*) 150 - 400 (K/uL)   DIFFERENTIAL     Status: Abnormal   Collection Time   05/13/11  4:08 AM      Component Value Range Comment   Neutrophils Relative 86 (*) 43 - 77 (%)    Neutro Abs 14.5 (*) 1.7 - 7.7 (K/uL)    Lymphocytes Relative 9 (*) 12 - 46 (%)    Lymphs Abs 1.5  0.7 - 4.0 (K/uL)    Monocytes Relative 5  3 - 12 (%)    Monocytes Absolute 0.9  0.1 - 1.0 (K/uL)    Eosinophils Relative 0  0 - 5 (%)    Eosinophils Absolute 0.0  0.0 - 0.7 (K/uL)    Basophils Relative 0  0 - 1 (%)    Basophils Absolute 0.0  0.0 - 0.1 (K/uL)   HEMOGLOBIN AND HEMATOCRIT, BLOOD     Status: Normal   Collection Time   05/13/11  5:58 PM      Component Value Range Comment   Hemoglobin 12.0  12.0 - 15.0 (g/dL)    HCT 52.8  41.3 - 24.4 (%)   CBC     Status: Abnormal   Collection Time   05/14/11  4:37 AM      Component Value Range Comment   WBC 13.0 (*) 4.0 - 10.5 (K/uL) WHITE COUNT CONFIRMED ON SMEAR   RBC 3.91  3.87 - 5.11 (MIL/uL)    Hemoglobin 11.0 (*) 12.0 - 15.0 (g/dL)    HCT 01.0  27.2 - 53.6 (%)    MCV 92.1  78.0 - 100.0 (fL)    MCH 28.1  26.0 - 34.0 (pg)    MCHC 30.6  30.0 - 36.0 (g/dL)    RDW 64.4  03.4 - 74.2 (%)    Platelets 359  150 - 400 (K/uL)   DIFFERENTIAL     Status: Abnormal   Collection Time   05/14/11  4:37 AM      Component Value Range Comment   Neutrophils Relative 75  43 - 77 (%)    Lymphocytes Relative 15  12 - 46 (%)    Monocytes Relative 9  3 - 12 (%)    Eosinophils Relative 1  0 - 5 (%)    Basophils Relative 0  0 - 1 (%)    Neutro Abs 9.7 (*) 1.7 - 7.7 (K/uL)    Lymphs Abs 2.0  0.7 - 4.0 (K/uL)    Monocytes Absolute 1.2 (*) 0.1 - 1.0 (K/uL)    Eosinophils Absolute 0.1  0.0 - 0.7 (K/uL)    Basophils Absolute 0.0  0.0 - 0.1 (K/uL)    WBC Morphology WHITE COUNT CONFIRMED ON SMEAR       Radiology/Results: Dg Ugi W/water Sol Cm  05/13/2011  *RADIOLOGY REPORT*  Clinical Data:  Recent gastric bypass surgery.  UPPER GI SERIES WITH KUB  Technique:  Routine upper GI series was performed with water- soluble contrast.  Fluoroscopy Time: 0.58 minutes   Comparison:  Upper GI dated 03/13/2011  Findings: The KUB demonstrates surgical clips and staples in the left upper quadrant from recent gastric bypass and surgical clips in the right upper quadrant from previous cholecystectomy.  Bowel gas pattern is normal.  The patient ingested 40 ml of water soluble contrast.  There was immediate passage of the contrast from the normal appearing esophagus into the gastric remnant and through the anastomosis into the jejunum.  No evidence of obstruction or extravasation.  IMPRESSION: No evidence of leak or obstruction after gastric bypass.  Original Report Authenticated By: Gwynn Burly, M.D.    Anti-infectives: Anti-infectives     Start     Dose/Rate Route Frequency Ordered Stop   05/12/11 0600   cefOXitin (MEFOXIN) 2 g in dextrose 5 % 50 mL IVPB        2 g 100 mL/hr over 30 Minutes Intravenous 60 min pre-op 05/12/11 0528 05/12/11 0725          Assessment/Plan: Problem List: Patient Active Problem List  Diagnoses  . DEPRESSION, MAJOR, RECURRENT  . ANXIETY  . IMPOTENCE INORGANIC  . HYPERTENSION, BENIGN SYSTEMIC  . HYPERTENSION  . GASTROESOPHAGEAL REFLUX, NO ESOPHAGITIS  . MENOPAUSAL SYNDROME  . BACK PAIN, LOW  . INSOMNIA NOS  . Obesity    Not ready for discharge yet.  Hopeful discharge tomorrow. 2 Days Post-Op    LOS: 2 days   Matt B. Daphine Deutscher, MD, [facility] [phone] beeper [phone]  05/14/2011 8:38 AM

## 2011-05-14 NOTE — Progress Notes (Signed)
Pt alert and oriented; still c/o headache but states much better than yesterday; has some nausea with headache but denies vomiting; tolerating water well; has had sips of coffee to see if she gets relief from headache; pt was off caffeine on her 2 week pre-op diet; no flatus or BM; voiding well; ambulating in halls well; pt already has follow up appts with [facility] and [facility]; will advance to protein shakes as tolerated; continue current plan of care. Talmadge Chad, RN Bariatric Nurse Coordinator

## 2011-05-15 MED ORDER — HYDROCODONE-ACETAMINOPHEN 7.5-500 MG/15ML PO SOLN
5.0000 mL | ORAL | Status: AC | PRN
Start: 2011-05-15 — End: 2011-05-25

## 2011-05-15 MED ORDER — PROMETHAZINE HCL 12.5 MG PO TABS
12.5000 mg | ORAL_TABLET | Freq: Four times a day (QID) | ORAL | Status: DC | PRN
Start: 2011-05-15 — End: 2013-04-18

## 2011-05-15 NOTE — Discharge Summary (Signed)
Physician Discharge Summary  Patient ID: Tiffany Bean MRN: [id number] DOB/AGE: [birth year] 50 y.o.  Admit date: 05/12/2011 Discharge date: 05/15/2011  Admission Diagnoses:  Morbid obesity  Discharge Diagnoses:  same  Active Problems:  * No active hospital problems. 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  UGI looked good ready for discharge taking protein shakes  Consults: none  Significant Diagnostic Studies: ugi    Discharge Exam: Blood pressure 127/65, pulse 73, temperature 98.4 F (36.9 C), temperature source Oral, resp. rate 16, height 5\' 2"  (1.575 m), weight 236 lb 8 oz (107.276 kg), SpO2 99.00%. Incisions ok  Disposition:   Discharge Orders    Future Appointments: Provider: Department: Dept Phone: Center:   05/23/2011 3:10 PM Valarie Merino, MD [facility] [phone] None     Future Orders Please Complete By Expires   Diet Carb Modified      Increase activity slowly        Medication List  As of 05/15/2011  6:47 AM   STOP taking these medications         DEXILANT PO         TAKE these medications         bisoprolol-hydrochlorothiazide 5-6.25 MG per tablet   Commonly known as: ZIAC   Take 1 tablet by mouth every morning.      buPROPion 300 MG 24 hr tablet   Commonly known as: WELLBUTRIN XL   Take 300 mg by mouth every morning.      HYDROcodone-acetaminophen 7.5-500 MG/15ML solution   Commonly known as: LORTAB   Take 5 mLs by mouth every 4 (four) hours as needed.      promethazine 12.5 MG tablet   Commonly known as: PHENERGAN   Take 1 tablet (12.5 mg total) by mouth every 6 (six) hours as needed for nausea.      simvastatin 40 MG tablet   Commonly known as: ZOCOR   Take 40 mg by mouth every evening.      valACYclovir 500 MG tablet   Commonly known as: VALTREX   Take 500 mg by mouth as needed. For inflammation           Follow-up Information    Follow up  with Runette Scifres B, MD in 2 weeks.   Contact information:   [employer] [facility] [address], [address] [address] [phone]          Signed: Valarie Merino 05/15/2011, 6:47 AM

## 2011-05-15 NOTE — Discharge Instructions (Signed)
Gastric Bypass Surgery Care After Refer to this sheet in the next few weeks. These discharge instructions provide you with general information on caring for yourself after you leave the hospital. Your caregiver may also give you specific instructions. Your treatment has been planned according to the most current medical practices available, but unavoidable complications sometimes occur. If you have any problems or questions after discharge, call your caregiver. HOME CARE INSTRUCTIONS  Activity  Take frequent walks throughout the day. This will help to prevent blood clots. Do not sit for longer than 45 minutes to 1 hour while awake for 4 to 6 weeks after surgery.   Continue to do coughing and deep breathing exercises once you get home. This will help to prevent pneumonia.   Do not do strenuous activities, such as heavy lifting, pushing, or pulling, until after your follow-up visit with your caregiver. Do not lift anything heavier than 10 lb (4.5 kg).   Talk with your caregiver about when you may return to work and your exercise routine.   Do not drive while taking prescription pain medicine.  Nutrition  It is very important that you drink at least 80 oz (2,400 mL) of fluid a day.   You should stay on a clear liquid diet until your follow-up visit with your caregiver. Keep sugar-free, clear liquid items on hand, including:   Tea: hot or cold. Drink only decaffeinated for the first month.   Broths: clear beef, chicken, vegetable.   Others: water, sugar-free frozen ice pops, flavored water, gelatin (after 1 week).   Do not consume caffeine for 1 month. Large amounts of caffeine can cause dehydration.   A dietician may also give you specific instructions.   Follow your caregiver's recommendations about vitamins and protein requirements after surgery.  Hygiene  You may shower and wash your hair 2 days after surgery. Pat incisions dry. Do not rub incisions with a washcloth or towel.    Follow your caregiver's recommendations about baths and pools following surgery.  Pain control  If a prescription medicine was given, follow your caregiver's directions.   You may feel some gas pain caused by the carbon dioxide used to inflate your abdomen during surgery. This pain can be felt in your chest, shoulder, back, or abdominal area. Moving around often is advised.  Incision care  You may have 4 or more small incisions. They are closed with skin adhesive strips. Skin adhesive strips can get wet and will fall off on their own. Check your incisions and surrounding area daily for any redness, swelling, discoloration, fluid (drainage), or bleeding. Dark red, dried blood may appear under these coverings. This is normal.   If you have a drain, it will be removed at your follow-up visit or before you leave the hospital.   If your drain is left in, follow your caregiver's instructions on drain care.   If your drain is taken out, keep a clean, dry bandage over the drain site.  SEEK MEDICAL CARE IF:   You develop persistent nausea and vomiting.   You have pain and discomfort with swallowing.   You have pain, swelling, or warmth in the lower extremities.   You have an oral temperature above 102 F (38.9 C).   You develop chills.   Your incision sites look red, swollen, or have drainage.   Your stool is black, tarry, or maroon in color.   You are lightheaded when standing.   You notice a bruise getting larger.   You   have any questions or concerns.  SEEK IMMEDIATE MEDICAL CARE IF:   You have chest pain.   You have severe calf pain or pain not relieved by medicine.   You develop shortness of breath or difficulty breathing.   There is bright red blood coming from the drain.   You feel confused.   You have slurred speech.   You suddenly feel weak.  MAKE SURE YOU:   Understand these instructions.   Will watch your condition.   Will get help right away if you are  not doing well or get worse.  Document Released: 09/11/2003 Document Revised: 01/16/2011 Document Reviewed: 06/19/2009 [employer] Patient Information 2012 [employer]. 

## 2011-05-15 NOTE — Progress Notes (Signed)
Pt alert and oriented; VSS; pt states headache is better today; husband at bedside; denies nausea or vomiting; tolerating protein shakes well; voiding without difficulty; ambulating in hallways without difficulty; c/o some abdominal discomfort but with relief from prn pain meds; pt already has follow up appts with [facility] and [facility]; aware of BELT program and support group; discharge instructions reviewed and pt and husband verbalized understanding of; questions answered. GASTRIC BYPASS/SLEEVE DISCHARGE INSTRUCTIONS   Drs. Fredrik Rigger, [place], [place], and [place]  Call if you have any problems.   Call [phone] and ask for the surgeon on call.    If you need immediate assistance come to the ER at  [facility] Tell the ER personnel that you are a new post-op gastric bypass patient. Signs and symptoms to report:   Severe vomiting or nausea. If you cannot tolerate clear liquids for longer than 1 day, you need to call your surgeon.    Abdominal pain which does not get better after taking your pain medication   Fever greater than 101 F degree   Difficulty breathing   Chest pain    Redness, swelling, drainage, or foul odor at incision sites    If your incisions open or pull apart   Swelling or pain in calf (lower leg)   Diarrhea, frequent watery, uncontrolled bowel movements.   Constipation, (no bowel movements for 3 days) if this occurs, Take Milk of Magnesia, 2 tablespoons by mouth, 3 times a day for 2 days if needed.  Call your doctor if constipation continues. Stop taking Milk of Magnesia once you have had a bowel movement. You may also use Miralax according to the label instructions.   Anything you consider "abnormal for you".   Normal side effects after Surgery:   Unable to sleep at night or concentrate   Irritability   Being tearful (crying) or depressed   These are common complaints, possibly related to your anesthesia, stress of surgery and change in lifestyle, that usually go away a few  weeks after surgery.  If these feelings continue, call your medical doctor.  Wound Care You may have surgical glue, steri-strips, or staples over your incisions after surgery.  Surgical glue:  Looks like a clear film over your incisions and will wear off gradually. Steri-strips: Strips of tape over your incisions. You may notice a yellowish color on the skin underneath the steri-strips. This is a substance used to make the steri-strips stick better. Do not pull the steri-strips off - let them fall off.  Staples: Cherlynn Polo may be removed before you leave the hospital. If you go home with staples, call [facility] [phone]) for an appointment with your surgeon's nurse to have staples removed in 7 - 10 days. Showering: You may shower two days after your surgery unless otherwise instructed by your surgeon. Wash gently around wounds with warm soapy water, rinse well, and gently pat dry.  If you have a drain, you may need someone to hold this while you shower. Avoid tub baths until staples are removed and incisions are healed.    Medications   Medications should be liquid or crushed if larger than the size of a dime.  Extended release pills should not be crushed.   Depending on the size and number of medications you take, you may need to stagger/change the time you take your medications so that you do not over-fill your pouch.    Make sure you follow-up with your primary care physician to make medication adjustments needed  during rapid weight loss and life-style adjustment.   If you are diabetic, follow up with the doctor that prescribes your diabetes medication(s) within one week after surgery and check your blood sugar regularly.   Do not drive while taking narcotics!   Do not take acetaminophen (Tylenol) and Roxicet or Lortab Elixir at the same time since these pain medications contain acetaminophen.  Diet at home: (First 2 Weeks) You will see the nutritionist two weeks after your  surgery. She will advance your diet if you are tolerating liquids well. Once at home, if you have severe vomiting or nausea and cannot tolerate clear liquids lasting longer than 1 day, call your surgeon.  Begin high protein shake 2 ounces every 3 hours, 5 - 6 times per day.  Gradually increase the amount you drink as tolerated.  You may find it easier to slowly sip shakes throughout the day.  It is important to get your proteins in first.   Protein Shake   Drink at least 2 ounces of shake 5-6 times per day   Each serving of protein shakes should have a minimum of 15 grams of protein and no more than 5 grams of carbohydrate    Increase the amount of protein shake you drink as tolerated   Protein powder may be added to fluids such as non-fat milk or Lactaid milk (limit to 20 grams added protein powder per serving   The initial goal is to drink at least 8 ounces of protein shake/drink per day (or as directed by the nutritionist). Some examples of protein shakes are [employer], [employer], EAS Edge HP, and Unjury. Hydration   Gradually increase the amount of water and other liquids as tolerated (See Acceptable Fluids)   Gradually increase the amount of protein shake as tolerated     Sip fluids slowly and throughout the day   May use Sugar substitutes, use sparingly (limit to 6 - 8 packets per day). Your fluid goal is 64 ounces of fluid daily. It may take a few weeks to build up to this.         32 oz (or more) should be clear liquids and 32 oz (or more) should be full liquids.         Liquids should not contain sugar, caffeine, or carbonation! Acceptable Fluids Clear Liquids:   Water or Sugar-free flavored water, Fruit H2O   Decaffeinated coffee or tea (sugar-free)   Crystal Lite, Wyler's Lite, Minute Maid Lite   Sugar-free Jell-O   Bouillon or broth   Sugar-free Popsicle:   *Less than 20 calories each; Limit 1 per day   Full Liquids:              Protein Shakes/Drinks + 2 choices  per day of other full liquids shown below.    Other full liquids must be: No more than 12 grams of Carbs per serving,  No more than 3 grams of Fat per serving   Strained low-fat cream soup   Non-Fat milk   Fat-free Lactaid Milk   Sugar-free yogurt (Dannon Lite & Fit) Vitamins and Minerals (Start 1 day after surgery unless otherwise directed)   2 Chewable Multivitamin / Multimineral Supplement (i.e. Centrum for Adults)   Chewable Calcium Citrate with Vitamin D-3. Take 1500 mg each day.           (Example: 3 Chewable Calcium Plus 600 with Vitamin D-3 can be found at [facility])         Vitamin  B-12, 350 - 500 micrograms (oral tablet) each day   Do not mix multivitamins containing iron with calcium supplements; take 2 hours   apart   Do not substitute Tums (calcium carbonate) for your calcium   Menstruating women and those at risk for anemia may need extra iron. Talk with your doctor to see if you need additional iron.    If you need extra iron:  Total daily Iron recommendations (including Vitamins) = 50 - 100 mg Iron/day Do not stop taking or change any vitamins or minerals until you talk to your nutritionist or surgeon. Your nutritionist and / or physician must approve all vitamin and mineral supplements. Exercise For maximum success, begin exercising as soon as your doctor recommends. Make sure your physician approves any physical activity.   Depending on fitness level, begin with a simple walking program   Walk 5-15 minutes each day, 7 days per week.    Slowly increase until you are walking 30-45 minutes per day   Consider joining our BELT program. [phone] or email belt@[employer] .edu Things to remember:    You may have sexual relations when you feel comfortable. It is VERY important for female patients to use a reliable birth control method. Fertility often increases after surgery. Do not get pregnant for at least 18 months.   It is very important to keep all follow up appointments with your  surgeon, nutritionist, primary care physician, and behavioral health practitioner. After the first year, please follow up with your bariatric surgeon at least once a year in order to maintain best weight loss results.  [facility]: [phone] [facility]: [phone]   Free counseling is available for you and your family through collaboration between [facility] and [place]. Please call [phone] and leave a message.    Consider purchasing a medical alert bracelet that says you had gastric bypass surgery.    [facility] has a free Bariatric Surgery Support Group that meets monthly, the 3rd Thursday, 6 pm, Classroom #1, [place]. You may register online at [email or web address], but registration is not necessary. Select Classes and Support Groups, Bariatric Surgery, or Call [phone]   Do not return to work or drive until cleared by your surgeon   Use your CPAP when sleeping if applicable  Talmadge Chad, RN Bariatric Nurse Coordinator

## 2011-05-16 ENCOUNTER — Telehealth (INDEPENDENT_AMBULATORY_CARE_PROVIDER_SITE_OTHER): Payer: Self-pay | Admitting: Surgery

## 2011-05-16 MED ORDER — FLUCONAZOLE 150 MG PO TABS: 150.0000 mg | ORAL_TABLET | Freq: Once | ORAL | Status: AC

## 2011-05-16 NOTE — Telephone Encounter (Signed)
Patient's husband, Bryella Diviney, called in, patient has developed a yeast infection due to meds, can you call in a Rx to [facility] on [address], If you can let me know when it has been sent so I can call her husband, thank you.

## 2011-05-22 ENCOUNTER — Encounter (HOSPITAL_COMMUNITY): Payer: Self-pay | Admitting: Surgery

## 2011-05-23 ENCOUNTER — Ambulatory Visit (INDEPENDENT_AMBULATORY_CARE_PROVIDER_SITE_OTHER): Payer: BC Managed Care – PPO | Admitting: Surgery

## 2011-05-27 ENCOUNTER — Telehealth (INDEPENDENT_AMBULATORY_CARE_PROVIDER_SITE_OTHER): Payer: Self-pay | Admitting: General Surgery

## 2011-05-27 ENCOUNTER — Encounter (INDEPENDENT_AMBULATORY_CARE_PROVIDER_SITE_OTHER): Payer: Self-pay

## 2011-05-27 NOTE — Telephone Encounter (Signed)
The patient requested to return to work on 06/02/11. Dr Daphine Deutscher out of the office, Dr Biagio Quint wrote a RTW note. Pt advised and asked for [place] to give it to her employer.

## 2011-06-04 ENCOUNTER — Encounter (INDEPENDENT_AMBULATORY_CARE_PROVIDER_SITE_OTHER): Payer: Self-pay | Admitting: Surgery

## 2011-06-04 ENCOUNTER — Ambulatory Visit (INDEPENDENT_AMBULATORY_CARE_PROVIDER_SITE_OTHER): Payer: BC Managed Care – PPO | Admitting: Surgery

## 2011-06-04 VITALS — BP 134/94 | HR 68 | Temp 97.4°F | Resp 18 | Ht 62.0 in | Wt 220.1 lb

## 2011-06-04 DIAGNOSIS — Z9884 Bariatric surgery status: Secondary | ICD-10-CM | POA: Insufficient documentation

## 2011-06-04 NOTE — Progress Notes (Signed)
Tiffany Bean 51 y.o.  Body mass index is 40.26 kg/(m^2).  Patient Active Problem List  Diagnoses  . DEPRESSION, MAJOR, RECURRENT  . ANXIETY  . IMPOTENCE INORGANIC  . HYPERTENSION, BENIGN SYSTEMIC  . HYPERTENSION  . GASTROESOPHAGEAL REFLUX, NO ESOPHAGITIS  . MENOPAUSAL SYNDROME  . BACK PAIN, LOW  . INSOMNIA NOS  . Obesity    Allergies  Allergen Reactions  . Codeine Other (See Comments)    Causes her to feel "loopy" will hallucinate.  . Oxycodone-Acetaminophen Other (See Comments)    hallucinations    Past Surgical History  Procedure Date  . Cholecystectomy 1995?  Marland Kitchen Abdominal hysterectomy 1986  . Incontinence surgery 1996  . 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  Valine is going through the usual nausea with certain foods. No vomiting.  Incisions are OK.  Continue post roux Y diet and return in 6 weeks.  Matt B. Daphine Deutscher, MD, [facility] [phone] beeper [phone]  06/04/2011 3:14 PM

## 2011-06-06 ENCOUNTER — Telehealth (INDEPENDENT_AMBULATORY_CARE_PROVIDER_SITE_OTHER): Payer: Self-pay | Admitting: General Surgery

## 2011-06-06 NOTE — Telephone Encounter (Signed)
Patient returned call and spoke with Dr. Daphine Deutscher directly. Patient stated she still has continuous nausea, difficulty with certain foods. Dr.Martin approved her to return to work on Jun 12, 2011.

## 2011-06-06 NOTE — Telephone Encounter (Signed)
Called patient per Dr. Ermalene Searing request and left a message asking her to call us back if possible before clinic ends today. He wants to get a status on how she has been feeling since LOV.

## 2011-08-01 ENCOUNTER — Ambulatory Visit (INDEPENDENT_AMBULATORY_CARE_PROVIDER_SITE_OTHER): Payer: BC Managed Care – PPO | Admitting: Surgery

## 2011-10-23 ENCOUNTER — Encounter (INDEPENDENT_AMBULATORY_CARE_PROVIDER_SITE_OTHER): Payer: Self-pay | Admitting: Surgery

## 2011-10-23 ENCOUNTER — Ambulatory Visit (INDEPENDENT_AMBULATORY_CARE_PROVIDER_SITE_OTHER): Payer: BC Managed Care – PPO | Admitting: Surgery

## 2011-10-23 VITALS — BP 110/76 | HR 60 | Resp 14 | Ht 62.0 in | Wt 186.2 lb

## 2011-10-23 DIAGNOSIS — Z9884 Bariatric surgery status: Secondary | ICD-10-CM

## 2011-10-23 NOTE — Progress Notes (Signed)
Tiffany Bean General 51 y.o.  Body mass index is 34.06 kg/(m^2).  Patient Active Problem List  Diagnosis  . DEPRESSION, MAJOR, RECURRENT  . ANXIETY  . IMPOTENCE INORGANIC  . HYPERTENSION, BENIGN SYSTEMIC  . HYPERTENSION  . GASTROESOPHAGEAL REFLUX, NO ESOPHAGITIS  . MENOPAUSAL SYNDROME  . BACK PAIN, LOW  . INSOMNIA NOS  . Obesity  . 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 Codeine Other (See Comments)    Causes her to feel "loopy" will hallucinate.  . Oxycodone-Acetaminophen Other (See Comments)    hallucinations    Past Surgical History  Procedure Date  . Cholecystectomy 1995?  Marland Kitchen Abdominal hysterectomy 1986  . Incontinence surgery 1996  . 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  Doing very well.  Will check labs at next visit in 3 months.  She has noticed no increased energy.  Will try to get her into BELT program.  54 lb weight loss thus far.   Matt B. Daphine Deutscher, MD, [facility] [phone] beeper [phone]  10/23/2011 9:42 AM

## 2011-10-23 NOTE — Patient Instructions (Signed)
Try to enter a structured exercise program--BELT program

## 2011-12-12 ENCOUNTER — Telehealth (INDEPENDENT_AMBULATORY_CARE_PROVIDER_SITE_OTHER): Payer: Self-pay | Admitting: General Surgery

## 2011-12-12 MED ORDER — PROMETHAZINE HCL 12.5 MG PO TABS: 12.5000 mg | ORAL_TABLET | Freq: Four times a day (QID) | ORAL | Status: DC | PRN

## 2011-12-12 NOTE — Telephone Encounter (Signed)
Ok to call phenergan to patient's pharmacy per Dr Daphine Deutscher. RX sent.

## 2012-04-26 ENCOUNTER — Other Ambulatory Visit: Payer: Self-pay

## 2012-04-26 DIAGNOSIS — Z1231 Encounter for screening mammogram for malignant neoplasm of breast: Secondary | ICD-10-CM

## 2012-04-29 ENCOUNTER — Other Ambulatory Visit (INDEPENDENT_AMBULATORY_CARE_PROVIDER_SITE_OTHER): Payer: Self-pay

## 2012-04-29 DIAGNOSIS — Z09 Encounter for follow-up examination after completed treatment for conditions other than malignant neoplasm: Secondary | ICD-10-CM

## 2012-05-04 ENCOUNTER — Other Ambulatory Visit (INDEPENDENT_AMBULATORY_CARE_PROVIDER_SITE_OTHER): Payer: Self-pay | Admitting: Surgery

## 2012-05-05 LAB — COMPREHENSIVE METABOLIC PANEL
ALT: 13 U/L (ref 0–35)
AST: 19 U/L (ref 0–37)
Albumin: 4.1 g/dL (ref 3.5–5.2)
Alkaline Phosphatase: 114 U/L (ref 39–117)
BUN: 18 mg/dL (ref 6–23)
CO2: 28 mEq/L (ref 19–32)
Calcium: 9.3 mg/dL (ref 8.4–10.5)
Chloride: 103 mEq/L (ref 96–112)
Creat: 0.75 mg/dL (ref 0.50–1.10)
Glucose, Bld: 81 mg/dL (ref 70–99)
Potassium: 4.4 mEq/L (ref 3.5–5.3)
Sodium: 140 mEq/L (ref 135–145)
Total Bilirubin: 0.3 mg/dL (ref 0.3–1.2)
Total Protein: 7 g/dL (ref 6.0–8.3)

## 2012-05-05 LAB — IBC PANEL
%SAT: 15 % — ABNORMAL LOW (ref 20–55)
TIBC: 331 ug/dL (ref 250–470)
UIBC: 283 ug/dL (ref 125–400)

## 2012-05-05 LAB — CBC WITH DIFFERENTIAL/PLATELET
Basophils Absolute: 0 10*3/uL (ref 0.0–0.1)
Basophils Relative: 1 % (ref 0–1)
Eosinophils Absolute: 0.3 10*3/uL (ref 0.0–0.7)
Eosinophils Relative: 3 % (ref 0–5)
HCT: 40.7 % (ref 36.0–46.0)
Hemoglobin: 13.6 g/dL (ref 12.0–15.0)
Lymphocytes Relative: 40 % (ref 12–46)
Lymphs Abs: 3.4 10*3/uL (ref 0.7–4.0)
MCH: 29.2 pg (ref 26.0–34.0)
MCHC: 33.4 g/dL (ref 30.0–36.0)
MCV: 87.5 fL (ref 78.0–100.0)
Monocytes Absolute: 0.5 10*3/uL (ref 0.1–1.0)
Monocytes Relative: 6 % (ref 3–12)
Neutro Abs: 4.4 10*3/uL (ref 1.7–7.7)
Neutrophils Relative %: 50 % (ref 43–77)
Platelets: 379 10*3/uL (ref 150–400)
RBC: 4.65 MIL/uL (ref 3.87–5.11)
RDW: 13.9 % (ref 11.5–15.5)
WBC: 8.6 10*3/uL (ref 4.0–10.5)

## 2012-05-05 LAB — LIPID PANEL
Cholesterol: 178 mg/dL (ref 0–200)
HDL: 75 mg/dL (ref >39)
LDL Cholesterol: 85 mg/dL (ref 0–99)
Total CHOL/HDL Ratio: 2.4 Ratio
Triglycerides: 92 mg/dL (ref <150)
VLDL: 18 mg/dL (ref 0–40)

## 2012-05-05 LAB — VITAMIN D 25 HYDROXY (VIT D DEFICIENCY, FRACTURES): Vit D, 25-Hydroxy: 56 ng/mL (ref 30–89)

## 2012-05-05 LAB — IRON: Iron: 48 ug/dL (ref 42–145)

## 2012-05-05 LAB — VITAMIN B12: Vitamin B-12: >2000 pg/mL — ABNORMAL HIGH (ref 211–911)

## 2012-05-05 LAB — MAGNESIUM: Magnesium: 2.1 mg/dL (ref 1.5–2.5)

## 2012-05-05 LAB — FOLATE: Folate: 2.4 ng/mL — ABNORMAL LOW

## 2012-05-18 ENCOUNTER — Other Ambulatory Visit (INDEPENDENT_AMBULATORY_CARE_PROVIDER_SITE_OTHER): Payer: Self-pay

## 2012-05-19 ENCOUNTER — Ambulatory Visit
Admission: RE | Admit: 2012-05-19 | Discharge: 2012-05-19 | Disposition: A | Discharge status: Home / Self Care | Payer: BC Managed Care – PPO | Source: Ambulatory Visit

## 2012-05-19 DIAGNOSIS — Z1231 Encounter for screening mammogram for malignant neoplasm of breast: Secondary | ICD-10-CM

## 2013-04-18 ENCOUNTER — Emergency Department (HOSPITAL_COMMUNITY)
Admission: EM | Admit: 2013-04-18 | Discharge: 2013-04-18 | Disposition: A | Discharge status: Home / Self Care | Payer: 59 | Attending: Emergency Medicine | Admitting: Emergency Medicine

## 2013-04-18 ENCOUNTER — Encounter (HOSPITAL_COMMUNITY): Payer: Self-pay | Admitting: Emergency Medicine

## 2013-04-18 DIAGNOSIS — J3489 Other specified disorders of nose and nasal sinuses: Secondary | ICD-10-CM | POA: Insufficient documentation

## 2013-04-18 DIAGNOSIS — R51 Headache: Secondary | ICD-10-CM | POA: Insufficient documentation

## 2013-04-18 DIAGNOSIS — R110 Nausea: Secondary | ICD-10-CM | POA: Insufficient documentation

## 2013-04-18 DIAGNOSIS — R630 Anorexia: Secondary | ICD-10-CM | POA: Insufficient documentation

## 2013-04-18 DIAGNOSIS — R5381 Other malaise: Secondary | ICD-10-CM | POA: Insufficient documentation

## 2013-04-18 DIAGNOSIS — R059 Cough, unspecified: Secondary | ICD-10-CM | POA: Insufficient documentation

## 2013-04-18 DIAGNOSIS — J029 Acute pharyngitis, unspecified: Secondary | ICD-10-CM

## 2013-04-18 DIAGNOSIS — R5383 Other fatigue: Secondary | ICD-10-CM

## 2013-04-18 DIAGNOSIS — K219 Gastro-esophageal reflux disease without esophagitis: Secondary | ICD-10-CM | POA: Insufficient documentation

## 2013-04-18 DIAGNOSIS — F329 Major depressive disorder, single episode, unspecified: Secondary | ICD-10-CM | POA: Insufficient documentation

## 2013-04-18 DIAGNOSIS — F3289 Other specified depressive episodes: Secondary | ICD-10-CM | POA: Insufficient documentation

## 2013-04-18 DIAGNOSIS — IMO0002 Reserved for concepts with insufficient information to code with codable children: Secondary | ICD-10-CM | POA: Insufficient documentation

## 2013-04-18 DIAGNOSIS — F411 Generalized anxiety disorder: Secondary | ICD-10-CM | POA: Insufficient documentation

## 2013-04-18 DIAGNOSIS — E785 Hyperlipidemia, unspecified: Secondary | ICD-10-CM | POA: Insufficient documentation

## 2013-04-18 DIAGNOSIS — R05 Cough: Secondary | ICD-10-CM | POA: Insufficient documentation

## 2013-04-18 DIAGNOSIS — Z9884 Bariatric surgery status: Secondary | ICD-10-CM | POA: Insufficient documentation

## 2013-04-18 DIAGNOSIS — E860 Dehydration: Secondary | ICD-10-CM | POA: Insufficient documentation

## 2013-04-18 DIAGNOSIS — I10 Essential (primary) hypertension: Secondary | ICD-10-CM | POA: Insufficient documentation

## 2013-04-18 DIAGNOSIS — R109 Unspecified abdominal pain: Secondary | ICD-10-CM | POA: Insufficient documentation

## 2013-04-18 LAB — CBC WITH DIFFERENTIAL/PLATELET
Basophils Absolute: 0 10*3/uL (ref 0.0–0.1)
Basophils Relative: 1 % (ref 0–1)
Eosinophils Absolute: 0.1 10*3/uL (ref 0.0–0.7)
Eosinophils Relative: 3 % (ref 0–5)
HCT: 39 % (ref 36.0–46.0)
Hemoglobin: 13 g/dL (ref 12.0–15.0)
Lymphocytes Relative: 53 % — ABNORMAL HIGH (ref 12–46)
Lymphs Abs: 1.6 10*3/uL (ref 0.7–4.0)
MCH: 28.8 pg (ref 26.0–34.0)
MCHC: 33.3 g/dL (ref 30.0–36.0)
MCV: 86.5 fL (ref 78.0–100.0)
Monocytes Absolute: 0.4 10*3/uL (ref 0.1–1.0)
Monocytes Relative: 14 % — ABNORMAL HIGH (ref 3–12)
Neutro Abs: 0.9 10*3/uL — ABNORMAL LOW (ref 1.7–7.7)
Neutrophils Relative %: 29 % — ABNORMAL LOW (ref 43–77)
Platelets: 179 10*3/uL (ref 150–400)
RBC: 4.51 MIL/uL (ref 3.87–5.11)
RDW: 13 % (ref 11.5–15.5)
WBC: 3 10*3/uL — ABNORMAL LOW (ref 4.0–10.5)

## 2013-04-18 LAB — URINALYSIS, ROUTINE W REFLEX MICROSCOPIC
Bilirubin Urine: NEGATIVE
Glucose, UA: NEGATIVE mg/dL
Hgb urine dipstick: NEGATIVE
Ketones, ur: NEGATIVE mg/dL
Leukocytes, UA: NEGATIVE
Nitrite: NEGATIVE
Protein, ur: NEGATIVE mg/dL
Specific Gravity, Urine: 1.011 (ref 1.005–1.030)
Urobilinogen, UA: 0.2 mg/dL (ref 0.0–1.0)
pH: 6 (ref 5.0–8.0)

## 2013-04-18 LAB — COMPREHENSIVE METABOLIC PANEL
ALT: 14 U/L (ref 0–35)
AST: 23 U/L (ref 0–37)
Albumin: 3.4 g/dL — ABNORMAL LOW (ref 3.5–5.2)
Alkaline Phosphatase: 90 U/L (ref 39–117)
BUN: 16 mg/dL (ref 6–23)
CO2: 23 mEq/L (ref 19–32)
Calcium: 9 mg/dL (ref 8.4–10.5)
Chloride: 97 mEq/L (ref 96–112)
Creatinine, Ser: 0.86 mg/dL (ref 0.50–1.10)
GFR calc Af Amer: 87 mL/min — ABNORMAL LOW (ref >90)
GFR calc non Af Amer: 75 mL/min — ABNORMAL LOW (ref >90)
Glucose, Bld: 75 mg/dL (ref 70–99)
Potassium: 4.6 mEq/L (ref 3.7–5.3)
Sodium: 136 mEq/L — ABNORMAL LOW (ref 137–147)
Total Bilirubin: <0.2 mg/dL — ABNORMAL LOW (ref 0.3–1.2)
Total Protein: 7.2 g/dL (ref 6.0–8.3)

## 2013-04-18 LAB — PATHOLOGIST SMEAR REVIEW

## 2013-04-18 MED ORDER — SODIUM CHLORIDE 0.9 % IV BOLUS (SEPSIS)
1000.0000 mL | Freq: Once | INTRAVENOUS | Status: AC
  Administered 2013-04-18: 1000 mL via INTRAVENOUS

## 2013-04-18 NOTE — ED Notes (Signed)
Patient is aware that we need a urine specimen. Patient is unable to provide at this time.

## 2013-04-18 NOTE — Discharge Instructions (Signed)
As discussed, your evaluation today has been largely reassuring.  But, it is important that you monitor your condition carefully, and do not hesitate to return to the ED if you develop new, or concerning changes in your condition. ? ?Otherwise, please follow-up with your physician for appropriate ongoing care. ? ?

## 2013-04-18 NOTE — ED Notes (Signed)
Pt states that last Wed she started with abd cramping and pain then congestion in her chest and head with sore throat. Pt is gastric pt and states she cant eat much any ways and was referred here from [facility] for further evaluation for dehydration.  Pt has contacted her GI MD but hasnt heard if he will be coming to see her while in ED or not. Pt denies vomiting or diarrhea. Pt states that her cough is not productive most of the time.

## 2013-04-18 NOTE — ED Provider Notes (Signed)
CSN: [id number]     Arrival date & time 04/18/13  8295 History   First MD Initiated Contact with Patient 04/18/13 [phone]     Chief Complaint  Patient presents with  . Dehydration     HPI  Patient presents with concern weakness, sore throat, sinus congestion. Symptoms began within the past week.  Initially there was abdominal discomfort, then patient developed congestion, cough, headache.  Subsequently the patient developed sinus pressure.  Over this time the patient has developed anorexia, nausea, sore throat. She denies fever, chills, vomiting, diarrhea. Patient has a notable history of gastric bypass 2 years ago, with 110 pound weight loss. Over the illness there has been no clear exacerbating factors.  Symptoms are better with OTC cold medication.   Past Medical History  Diagnosis Date  . Hypertension   . Hyperlipidemia   . Anxiety   . Depression   . GERD (gastroesophageal reflux disease)   . Degenerative disc disease   . PONV (postoperative nausea and vomiting)    Past Surgical History  Procedure Laterality Date  . Cholecystectomy  1995?  Marland Kitchen Abdominal hysterectomy  1986  . Incontinence surgery  1996  . Gastric roux-en-y  05/12/2011    Procedure: LAPAROSCOPIC ROUX-EN-Y GASTRIC;  Surgeon: Valarie Merino, MD;  Location: [facility];  Service: General;  Laterality: N/A;   Family History  Problem Relation Age of Onset  . Heart disease Father   . COPD Father    History  Substance Use Topics  . Smoking status: Never Smoker   . Smokeless tobacco: Never Used  . Alcohol Use: No   OB History   Grav Para Term Preterm Abortions TAB SAB Ect Mult Living                 Review of Systems  Constitutional: Positive for fatigue.  HENT: Positive for sore throat.   Respiratory:       Per HPI, otherwise negative  Cardiovascular:       Per HPI, otherwise negative  Gastrointestinal: Negative for vomiting, abdominal pain and diarrhea.  Endocrine:       Negative aside from HPI   Genitourinary:       Neg aside from HPI   Musculoskeletal:       Per HPI, otherwise negative  Skin: Negative for rash.  Neurological: Negative for syncope and weakness.      Allergies  Codeine and Oxycodone-acetaminophen  Home Medications   Current Outpatient Rx  Name  Route  Sig  Dispense  Refill  . bisoprolol-hydrochlorothiazide (ZIAC) 5-6.25 MG per tablet   Oral   Take 1 tablet by mouth every morning.          Marland Kitchen buPROPion (WELLBUTRIN XL) 300 MG 24 hr tablet   Oral   Take 300 mg by mouth every morning.          . citalopram (CELEXA) 40 MG tablet   Oral   Take 40 mg by mouth daily.         . DULoxetine (CYMBALTA) 60 MG capsule   Oral   Take 60 mg by mouth 2 (two) times daily.         Marland Kitchen EXPIRED: promethazine (PHENERGAN) 12.5 MG tablet   Oral   Take 1 tablet (12.5 mg total) by mouth every 6 (six) hours as needed for nausea.   30 tablet   0   . promethazine (PHENERGAN) 12.5 MG tablet   Oral   Take 1 tablet (12.5 mg total) by  mouth every 6 (six) hours as needed for nausea.   25 tablet   0   . rOPINIRole (REQUIP) 1 MG tablet   Oral   Take 1 mg by mouth as needed.         . simvastatin (ZOCOR) 40 MG tablet   Oral   Take 40 mg by mouth every evening.         . valACYclovir (VALTREX) 500 MG tablet   Oral   Take 500 mg by mouth as needed. For inflammation          BP 132/81  Pulse 65  Temp(Src) 98 F (36.7 C) (Oral)  Resp 16  SpO2 97% Physical Exam  Nursing note and vitals reviewed. Constitutional: She is oriented to person, place, and time. She appears well-developed and well-nourished. No distress.  HENT:  Head: Normocephalic and atraumatic.  Mouth/Throat: Uvula is midline. Posterior oropharyngeal erythema present. No oropharyngeal exudate, posterior oropharyngeal edema or tonsillar abscesses.  Eyes: Conjunctivae and EOM are normal.  Cardiovascular: Normal rate and regular rhythm.   Pulmonary/Chest: Effort normal and breath sounds  normal. No stridor. No respiratory distress.  Abdominal: She exhibits no distension. There is no tenderness. There is no rebound and no guarding.  Musculoskeletal: She exhibits no edema.  Neurological: She is alert and oriented to person, place, and time. No cranial nerve deficit.  Skin: Skin is warm and dry.  Psychiatric: She has a normal mood and affect.    ED Course  Procedures (including critical care time) Labs Review Labs Reviewed  CBC WITH DIFFERENTIAL  COMPREHENSIVE METABOLIC PANEL  URINALYSIS, ROUTINE W REFLEX MICROSCOPIC   Imaging Review No results found.   I reviewed the patient's EMR  12:25 PM Patient states that she feels better. We discussed all results, vital signs. She will followup with her primary care physician for repeat check later this week.  MDM   Patient presents with concerns or throat, possible dehydration.  On exam patient is awake, alert, hemodynamically stable, neurologically intact, with,.  The abdomen, no evidence of systemic infection. Patient's labs are largely reassuring, and she presents here with IV fluids.  The patient has an erythematous posterior oropharynx, there is no uvula swelling, no evidence of impending respiratory compromise.  Patient is appropriate for discharge with outpatient followup.   Gerhard Munchobert Aram Domzalski, MD 04/18/13 1226

## 2013-04-18 NOTE — ED Notes (Signed)
Initial Contact - pt resting on stretcher with family at bs, pt denies pain or other complaints at this time.  Skin PWD.  Speaking full/clear sentences, rr even/un-lab.  Pt aware of need for urine spec, sts unable to provide at this time.  NAD.

## 2013-07-05 ENCOUNTER — Other Ambulatory Visit (INDEPENDENT_AMBULATORY_CARE_PROVIDER_SITE_OTHER): Payer: Self-pay

## 2013-07-05 DIAGNOSIS — K912 Postsurgical malabsorption, not elsewhere classified: Secondary | ICD-10-CM

## 2013-07-05 DIAGNOSIS — Z09 Encounter for follow-up examination after completed treatment for conditions other than malignant neoplasm: Secondary | ICD-10-CM

## 2013-07-05 DIAGNOSIS — Z9884 Bariatric surgery status: Secondary | ICD-10-CM

## 2013-07-07 LAB — COMPREHENSIVE METABOLIC PANEL
ALT: 13 U/L (ref 0–35)
AST: 19 U/L (ref 0–37)
Albumin: 3.8 g/dL (ref 3.5–5.2)
Alkaline Phosphatase: 79 U/L (ref 39–117)
BUN: 19 mg/dL (ref 6–23)
CO2: 30 mEq/L (ref 19–32)
Calcium: 9.1 mg/dL (ref 8.4–10.5)
Chloride: 100 mEq/L (ref 96–112)
Creat: 0.81 mg/dL (ref 0.50–1.10)
Glucose, Bld: 82 mg/dL (ref 70–99)
Potassium: 4.1 mEq/L (ref 3.5–5.3)
Sodium: 133 mEq/L — ABNORMAL LOW (ref 135–145)
Total Bilirubin: 0.5 mg/dL (ref 0.2–1.2)
Total Protein: 6.5 g/dL (ref 6.0–8.3)

## 2013-07-26 LAB — LIPID PANEL
Cholesterol: 179 mg/dL (ref 0–200)
HDL: 79 mg/dL (ref >39)
LDL Cholesterol: 87 mg/dL (ref 0–99)
Total CHOL/HDL Ratio: 2.3 Ratio
Triglycerides: 65 mg/dL (ref <150)
VLDL: 13 mg/dL (ref 0–40)

## 2013-07-26 LAB — CBC WITH DIFFERENTIAL/PLATELET
Basophils Absolute: 0.1 10*3/uL (ref 0.0–0.1)
Basophils Relative: 1 % (ref 0–1)
Eosinophils Absolute: 0.3 10*3/uL (ref 0.0–0.7)
Eosinophils Relative: 5 % (ref 0–5)
HCT: 39 % (ref 36.0–46.0)
Hemoglobin: 13 g/dL (ref 12.0–15.0)
Lymphocytes Relative: 51 % — ABNORMAL HIGH (ref 12–46)
Lymphs Abs: 2.6 10*3/uL (ref 0.7–4.0)
MCH: 28.8 pg (ref 26.0–34.0)
MCHC: 33.3 g/dL (ref 30.0–36.0)
MCV: 86.5 fL (ref 78.0–100.0)
Monocytes Absolute: 0.4 10*3/uL (ref 0.1–1.0)
Monocytes Relative: 7 % (ref 3–12)
Neutro Abs: 1.8 10*3/uL (ref 1.7–7.7)
Neutrophils Relative %: 36 % — ABNORMAL LOW (ref 43–77)
Platelets: 334 10*3/uL (ref 150–400)
RBC: 4.51 MIL/uL (ref 3.87–5.11)
RDW: 14.3 % (ref 11.5–15.5)
WBC: 5.1 10*3/uL (ref 4.0–10.5)

## 2013-07-26 LAB — IRON AND TIBC
%SAT: 25 % (ref 20–55)
Iron: 74 ug/dL (ref 42–145)
TIBC: 292 ug/dL (ref 250–470)
UIBC: 218 ug/dL (ref 125–400)

## 2013-07-26 LAB — FOLATE: Folate: 4.6 ng/mL

## 2013-07-26 LAB — VITAMIN B12: Vitamin B-12: >2000 pg/mL — ABNORMAL HIGH (ref 211–911)

## 2013-07-29 LAB — VITAMIN B1: Vitamin B1 (Thiamine): 12 nmol/L (ref 8–30)

## 2013-09-09 ENCOUNTER — Other Ambulatory Visit: Payer: Self-pay

## 2013-09-09 DIAGNOSIS — Z1231 Encounter for screening mammogram for malignant neoplasm of breast: Secondary | ICD-10-CM

## 2013-09-16 ENCOUNTER — Ambulatory Visit: Payer: BC Managed Care – PPO

## 2015-04-20 ENCOUNTER — Other Ambulatory Visit: Payer: Self-pay

## 2015-04-20 DIAGNOSIS — Z1231 Encounter for screening mammogram for malignant neoplasm of breast: Secondary | ICD-10-CM

## 2015-05-09 ENCOUNTER — Ambulatory Visit
Admission: RE | Admit: 2015-05-09 | Discharge: 2015-05-09 | Disposition: A | Discharge status: Home / Self Care | Payer: No Typology Code available for payment source | Source: Ambulatory Visit

## 2015-05-09 DIAGNOSIS — Z1231 Encounter for screening mammogram for malignant neoplasm of breast: Secondary | ICD-10-CM

## 2015-11-16 ENCOUNTER — Encounter (HOSPITAL_COMMUNITY): Payer: Self-pay

## 2016-02-11 HISTORY — PX: AUGMENTATION MAMMAPLASTY: SUR837

## 2017-12-07 ENCOUNTER — Encounter (HOSPITAL_COMMUNITY): Payer: Self-pay

## 2019-06-17 ENCOUNTER — Ambulatory Visit: Payer: 59 | Admitting: Orthopaedic Surgery

## 2019-06-17 ENCOUNTER — Other Ambulatory Visit: Payer: Self-pay

## 2019-06-17 ENCOUNTER — Ambulatory Visit (INDEPENDENT_AMBULATORY_CARE_PROVIDER_SITE_OTHER): Payer: 59

## 2019-06-17 ENCOUNTER — Encounter: Payer: Self-pay | Admitting: Orthopaedic Surgery

## 2019-06-17 VITALS — Ht 61.0 in | Wt 142.0 lb

## 2019-06-17 DIAGNOSIS — M25511 Pain in right shoulder: Secondary | ICD-10-CM | POA: Diagnosis not present

## 2019-06-17 MED ORDER — BUPIVACAINE HCL 0.25 % IJ SOLN
2.0000 mL | INTRAMUSCULAR | Status: AC | PRN
  Administered 2019-06-17: 2 mL via INTRA_ARTICULAR

## 2019-06-17 MED ORDER — LIDOCAINE HCL 2 % IJ SOLN
2.0000 mL | INTRAMUSCULAR | Status: AC | PRN
  Administered 2019-06-17: 2 mL

## 2019-06-17 MED ORDER — METHYLPREDNISOLONE ACETATE 40 MG/ML IJ SUSP
40.0000 mg | INTRAMUSCULAR | Status: AC | PRN
  Administered 2019-06-17: 40 mg via INTRA_ARTICULAR

## 2019-06-17 NOTE — Progress Notes (Signed)
Office Visit Note   Patient: Tiffany Bean           Date of Birth: 08/15/1960           MRN: [id number] Visit Date: 06/17/2019              Requested by: No referring provider defined for this encounter. PCP: [name], NP (Inactive)   Assessment & Plan: Visit Diagnoses:  1. Acute pain of right shoulder     Plan: Impression is right shoulder subacromial bursitis and rotator cuff tendinitis from overuse.  We will inject this with cortisone today.  Home exercises and resistance bands provided today.  She will follow-up if she does not notice any pain relief.  Follow-Up Instructions: Return if symptoms worsen or fail to improve.   Orders:  Orders Placed This Encounter  Procedures  . Large Joint Inj: R subacromial bursa  . XR Shoulder Right   No orders of the defined types were placed in this encounter.     Procedures: Large Joint Inj: R subacromial bursa on 06/17/2019 9:31 AM Indications: pain Details: 22 G needle Medications: 2 mL lidocaine 2 %; 2 mL bupivacaine 0.25 %; 40 mg methylPREDNISolone acetate 40 MG/ML Outcome: tolerated well, no immediate complications Patient was prepped and draped in the usual sterile fashion.       Clinical Data: No additional findings.   Subjective: Chief Complaint  Patient presents with  . Right Shoulder - Pain    HPI patient is a pleasant 59 year old female who comes in today with right shoulder pain.  This began approximately 1 month ago and has progressively worsened.  No known injury, but she does note that she has been cleaning up debris from her barn for the past several months.  She has taken a break over the past several weeks without any relief of symptoms.  The pain she has is to the deltoid and occasionally radiates up the lateral side of her neck.  She describes this as a constant ache worse with forward flexion, internal rotation as well as sleeping on the right side.  Her pain is a lot worse at night.  She has been  taking Advil without significant relief of symptoms.  She does note some burning to the deltoid as well.  No previous injection or surgical intervention to the right shoulder.  She does note a history of similar pain a few years ago which was relieved with rest.  Review of Systems as detailed in HPI.  All others reviewed and are negative.   Objective: Vital Signs: Ht 5\' 1"  (1.549 m)   Wt 142 lb (64.4 kg)   BMI 26.83 kg/m   Physical Exam well-developed well-nourished female no acute distress.  Alert and oriented x3.  Ortho Exam examination of her right shoulder reveals full active range of motion in all planes.  She does have a positive empty can and cross body adduction test.  4 out of 5 strength with resisted external rotation which I think is pain mediated.  No tenderness to the [facility] joint or bicipital groove.  She is neurovascular intact distally.  Specialty Comments:  No specialty comments available.  Imaging: XR Shoulder Right  Result Date: 06/17/2019 No acute or structural abnormalities    PMFS History: Patient Active Problem List   Diagnosis Date Noted  . 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 Obesity 02/26/2011  . HYPERTENSION 04/29/2007  . DEPRESSION, MAJOR, RECURRENT 04/09/2006  . ANXIETY 04/09/2006  .  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 HYPERTENSION, BENIGN SYSTEMIC 04/09/2006  . GASTROESOPHAGEAL REFLUX, NO ESOPHAGITIS 04/09/2006  . MENOPAUSAL SYNDROME 04/09/2006  . BACK PAIN, LOW 04/09/2006  . INSOMNIA NOS 04/09/2006   Past Medical History:  Diagnosis Date  . Anxiety   . Degenerative disc disease   . Depression   . GERD (gastroesophageal reflux disease)   . Hyperlipidemia   . Hypertension   . PONV (postoperative nausea and vomiting)     Family History  Problem Relation Age of Onset  . Heart disease Father   . COPD Father     Past Surgical History:  Procedure Laterality Date  . ABDOMINAL HYSTERECTOMY  1986  . CHOLECYSTECTOMY  1995?  Marland Kitchen  GASTRIC ROUX-EN-Y  05/12/2011   Procedure: LAPAROSCOPIC ROUX-EN-Y GASTRIC;  Surgeon: Pedro Earls, MD;  Location: [facility];  Service: General;  Laterality: N/A;  . [place] History   Occupational History  . Not on file  Tobacco Use  . Smoking status: Never Smoker  . Smokeless tobacco: Never Used  Substance and Sexual Activity  . Alcohol use: No  . Drug use: No  . Sexual activity: Not on file

## 2019-09-14 ENCOUNTER — Ambulatory Visit (INDEPENDENT_AMBULATORY_CARE_PROVIDER_SITE_OTHER): Payer: 59 | Admitting: Orthopaedic Surgery

## 2019-09-14 ENCOUNTER — Encounter: Payer: Self-pay | Admitting: Orthopaedic Surgery

## 2019-09-14 VITALS — Ht 62.0 in | Wt 139.4 lb

## 2019-09-14 DIAGNOSIS — M25511 Pain in right shoulder: Secondary | ICD-10-CM | POA: Diagnosis not present

## 2019-09-14 DIAGNOSIS — G8929 Other chronic pain: Secondary | ICD-10-CM

## 2019-09-14 MED ORDER — BUPIVACAINE HCL 0.5 % IJ SOLN
3.0000 mL | INTRAMUSCULAR | Status: AC | PRN
  Administered 2019-09-14: 3 mL via INTRA_ARTICULAR

## 2019-09-14 MED ORDER — METHYLPREDNISOLONE ACETATE 40 MG/ML IJ SUSP
40.0000 mg | INTRAMUSCULAR | Status: AC | PRN
  Administered 2019-09-14: 40 mg via INTRA_ARTICULAR

## 2019-09-14 MED ORDER — LIDOCAINE HCL 1 % IJ SOLN
3.0000 mL | INTRAMUSCULAR | Status: AC | PRN
  Administered 2019-09-14: 3 mL

## 2019-09-14 NOTE — Progress Notes (Signed)
Office Visit Note   Patient: Tiffany Bean           Date of Birth: [birth year]           MRN: [id number] Visit Date: 09/14/2019              Requested by: No referring provider defined for this encounter. PCP: [name], NP (Inactive)   Assessment & Plan: Visit Diagnoses:  1. Chronic right shoulder pain     Plan: I impression is chronic right shoulder pain with temporary relief from subacromial injection.  She requests another injection today which we repeated.  We will need to obtain MRI to rule out structural normalities.  We will also obtain arthritis panel to rule out autoimmune disorders given multiple joint issues.  Follow-up after the MRI.  Follow-Up Instructions: Return if symptoms worsen or fail to improve.   Orders:  No orders of the defined types were placed in this encounter.  No orders of the defined types were placed in this encounter.     Procedures: Large Joint Inj: R subacromial bursa on 09/14/2019 8:34 AM Indications: pain Details: 22 G needle  Arthrogram: No  Medications: 3 mL lidocaine 1 %; 3 mL bupivacaine 0.5 %; 40 mg methylPREDNISolone acetate 40 MG/ML Outcome: tolerated well, no immediate complications Consent was given by the patient. Patient was prepped and draped in the usual sterile fashion.       Clinical Data: No additional findings.   Subjective: Chief Complaint  Patient presents with  . Right Shoulder - Pain    Tiffany Bean returns today for continued right shoulder pain.  We saw her back in early May for the same issue.  We provide her with a subacromial injection which helped really well for about a month.  Now the pain has come back and she feels like overall in the last month it is worse.  She has trouble sleeping at night.  She also has whole body aches and pain in her hips and knees and legs.   Review of Systems  Constitutional: Negative.   HENT: Negative.   Eyes: Negative.   Respiratory: Negative.   Cardiovascular:  Negative.   Endocrine: Negative.   Musculoskeletal: Negative.   Neurological: Negative.   Hematological: Negative.   Psychiatric/Behavioral: Negative.   All other systems reviewed and are negative.    Objective: Vital Signs: Ht 5\' 2"  (1.575 m)   Wt 139 lb 6.4 oz (63.2 kg)   BMI 25.50 kg/m   Physical Exam Vitals and nursing note reviewed.  Constitutional:      Appearance: She is well-developed.  Pulmonary:     Effort: Pulmonary effort is normal.  Skin:    General: Skin is warm.     Capillary Refill: Capillary refill takes less than 2 seconds.  Neurological:     Mental Status: She is alert and oriented to person, place, and time.  Psychiatric:        Behavior: Behavior normal.        Thought Content: Thought content normal.        Judgment: Judgment normal.     Ortho Exam Right shoulder shows a full range of motion with moderate pain.  Positive Hawkins sign.  Manual muscle testing is slightly weak secondary to pain. Specialty Comments:  No specialty comments available.  Imaging: No results found.   PMFS History: Patient Active Problem List   Diagnosis Date Noted  . 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  Obesity 02/26/2011  . HYPERTENSION 04/29/2007  . DEPRESSION, MAJOR, RECURRENT 04/09/2006  . ANXIETY 04/09/2006  . 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 HYPERTENSION, BENIGN SYSTEMIC 04/09/2006  . GASTROESOPHAGEAL REFLUX, NO ESOPHAGITIS 04/09/2006  . MENOPAUSAL SYNDROME 04/09/2006  . BACK PAIN, LOW 04/09/2006  . INSOMNIA NOS 04/09/2006   Past Medical History:  Diagnosis Date  . Anxiety   . Degenerative disc disease   . Depression   . GERD (gastroesophageal reflux disease)   . Hyperlipidemia   . Hypertension   . PONV (postoperative nausea and vomiting)     Family History  Problem Relation Age of Onset  . Heart disease Father   . COPD Father     Past Surgical History:  Procedure Laterality Date  . ABDOMINAL HYSTERECTOMY  1986  .  CHOLECYSTECTOMY  1995?  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 INCONTINENCE SURGERY  1996   Social History   Occupational History  . Not on file  Tobacco Use  . Smoking status: Never Smoker  . Smokeless tobacco: Never Used  Substance and Sexual Activity  . Alcohol use: No  . Drug use: No  . Sexual activity: Not on file

## 2019-09-14 NOTE — Addendum Note (Signed)
Addended by: Albertina Parr on: 09/14/2019 09:00 AM   Modules accepted: Orders

## 2019-09-14 NOTE — Addendum Note (Signed)
Addended by: Albertina Parr on: 09/14/2019 09:01 AM   Modules accepted: Orders

## 2019-09-15 LAB — URIC ACID: Uric Acid, Serum: 3.9 mg/dL (ref 2.5–7.0)

## 2019-09-15 LAB — SEDIMENTATION RATE: Sed Rate: 6 mm/h (ref 0–30)

## 2019-09-15 LAB — ANA: Anti Nuclear Antibody (ANA): NEGATIVE

## 2019-09-15 LAB — RHEUMATOID FACTOR: Rhuematoid fact SerPl-aCnc: <14 IU/mL (ref <14)

## 2019-09-16 ENCOUNTER — Telehealth: Payer: Self-pay

## 2019-09-16 NOTE — Progress Notes (Signed)
Everything normal.

## 2019-09-16 NOTE — Telephone Encounter (Signed)
-----   Message from Tarry Kos, MD sent at 09/16/2019  7:48 AM EDT ----- Everything normal.

## 2019-09-16 NOTE — Telephone Encounter (Signed)
Informed patient of results

## 2019-10-05 ENCOUNTER — Other Ambulatory Visit: Payer: Self-pay | Admitting: Obstetrics and Gynecology

## 2019-10-05 DIAGNOSIS — Z1231 Encounter for screening mammogram for malignant neoplasm of breast: Secondary | ICD-10-CM

## 2019-10-08 ENCOUNTER — Other Ambulatory Visit: Payer: 59

## 2019-10-11 ENCOUNTER — Ambulatory Visit: Payer: 59 | Admitting: Orthopaedic Surgery

## 2019-11-05 ENCOUNTER — Ambulatory Visit
Admission: RE | Admit: 2019-11-05 | Discharge: 2019-11-05 | Disposition: A | Discharge status: Home / Self Care | Payer: 59 | Source: Ambulatory Visit | Attending: Orthopaedic Surgery | Admitting: Orthopaedic Surgery

## 2019-11-05 DIAGNOSIS — M25511 Pain in right shoulder: Secondary | ICD-10-CM

## 2019-11-05 DIAGNOSIS — G8929 Other chronic pain: Secondary | ICD-10-CM

## 2019-11-10 ENCOUNTER — Encounter: Payer: Self-pay | Admitting: Orthopaedic Surgery

## 2019-11-10 ENCOUNTER — Ambulatory Visit (INDEPENDENT_AMBULATORY_CARE_PROVIDER_SITE_OTHER): Payer: 59 | Admitting: Orthopaedic Surgery

## 2019-11-10 VITALS — Ht 62.0 in | Wt 139.0 lb

## 2019-11-10 DIAGNOSIS — G8929 Other chronic pain: Secondary | ICD-10-CM | POA: Diagnosis not present

## 2019-11-10 DIAGNOSIS — M25511 Pain in right shoulder: Secondary | ICD-10-CM | POA: Diagnosis not present

## 2019-11-10 NOTE — Progress Notes (Signed)
   Office Visit Note   Patient: Tiffany Bean           Date of Birth: 10-Nov-1960           MRN: [id number] Visit Date: 11/10/2019              Requested by: No referring provider defined for this encounter. PCP: [name], NP (Inactive)   Assessment & Plan: Visit Diagnoses:  1. Chronic right shoulder pain     Plan: MRI shows severe tendinosis of the supraspinatus with partial articular surface tearing and a small full-thickness component anteriorly.  These findings were reviewed with the patient detail.  Overall she is not appear to be too symptomatic and based on our discussion she would like to just give this more time as the subacromial injection and physical therapy and rest have significantly improved her symptoms.  We will see her back as needed.  Follow-Up Instructions: Return if symptoms worsen or fail to improve.   Orders:  No orders of the defined types were placed in this encounter.  No orders of the defined types were placed in this encounter.     Procedures: No procedures performed   Clinical Data: No additional findings.   Subjective: Chief Complaint  Patient presents with  . Right Shoulder - Follow-up    MRI review    Tiffany Bean is coming in today for follow-up of a recent right shoulder MRI.  She states that overall the pain has improved.  She is right-hand dominant.  Denies any changes otherwise.   Review of Systems   Objective: Vital Signs: Ht 5\' 2"  (1.575 m)   Wt 139 lb (63 kg)   BMI 25.42 kg/m   Physical Exam  Ortho Exam Right shoulder shows minimal pain with empty can testing.  Strength is well-preserved.  Range of motion is well-preserved. Specialty Comments:  No specialty comments available.  Imaging: No results found.   PMFS History: Patient Active Problem List   Diagnosis Date Noted  . 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 Obesity 02/26/2011  . HYPERTENSION 04/29/2007  . DEPRESSION, MAJOR, RECURRENT  04/09/2006  . ANXIETY 04/09/2006  . 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 HYPERTENSION, BENIGN SYSTEMIC 04/09/2006  . GASTROESOPHAGEAL REFLUX, NO ESOPHAGITIS 04/09/2006  . MENOPAUSAL SYNDROME 04/09/2006  . BACK PAIN, LOW 04/09/2006  . INSOMNIA NOS 04/09/2006   Past Medical History:  Diagnosis Date  . Anxiety   . Degenerative disc disease   . Depression   . GERD (gastroesophageal reflux disease)   . Hyperlipidemia   . Hypertension   . PONV (postoperative nausea and vomiting)     Family History  Problem Relation Age of Onset  . Heart disease Father   . COPD Father     Past Surgical History:  Procedure Laterality Date  . ABDOMINAL HYSTERECTOMY  1986  . CHOLECYSTECTOMY  1995?  04/11/2006 GASTRIC ROUX-EN-Y  05/12/2011   Procedure: LAPAROSCOPIC ROUX-EN-Y GASTRIC;  Surgeon: 07/12/2011, MD;  Location: [facility];  Service: General;  Laterality: N/A;  . INCONTINENCE SURGERY  1996   Social History   Occupational History  . Not on file  Tobacco Use  . Smoking status: Never Smoker  . Smokeless tobacco: Never Used  Substance and Sexual Activity  . Alcohol use: No  . Drug use: No  . Sexual activity: Not on file

## 2019-11-17 ENCOUNTER — Telehealth: Payer: Self-pay | Admitting: Orthopaedic Surgery

## 2019-11-17 ENCOUNTER — Other Ambulatory Visit: Payer: Self-pay

## 2019-11-17 MED ORDER — PREDNISONE 10 MG (21) PO TBPK: ORAL_TABLET | ORAL | 0 refills | Status: DC

## 2019-11-17 NOTE — Telephone Encounter (Signed)
Pt called stating she's experiencing pain in both legs and would like to see if she can have something called in; pt states its starting in her hips and flowing down to her feet. Pt would like a CB to update her on wether we will call this in for her  [phone]

## 2019-11-17 NOTE — Telephone Encounter (Signed)
I sent in prednisone dose pak

## 2019-11-18 NOTE — Telephone Encounter (Signed)
Patient aware.

## 2020-01-31 ENCOUNTER — Encounter: Payer: Self-pay | Admitting: Orthopaedic Surgery

## 2020-01-31 ENCOUNTER — Ambulatory Visit (INDEPENDENT_AMBULATORY_CARE_PROVIDER_SITE_OTHER): Payer: 59 | Admitting: Orthopaedic Surgery

## 2020-01-31 DIAGNOSIS — M75101 Unspecified rotator cuff tear or rupture of right shoulder, not specified as traumatic: Secondary | ICD-10-CM

## 2020-01-31 MED ORDER — BUPIVACAINE HCL 0.5 % IJ SOLN
3.0000 mL | INTRAMUSCULAR | Status: AC | PRN
  Administered 2020-01-31: 3 mL via INTRA_ARTICULAR

## 2020-01-31 MED ORDER — METHYLPREDNISOLONE ACETATE 40 MG/ML IJ SUSP
40.0000 mg | INTRAMUSCULAR | Status: AC | PRN
  Administered 2020-01-31: 40 mg via INTRA_ARTICULAR

## 2020-01-31 MED ORDER — LIDOCAINE HCL 1 % IJ SOLN
3.0000 mL | INTRAMUSCULAR | Status: AC | PRN
  Administered 2020-01-31: 3 mL

## 2020-01-31 NOTE — Progress Notes (Signed)
Office Visit Note   Patient: Tiffany Bean           Date of Birth: 06/20/1960           MRN: [id number] Visit Date: 01/31/2020              Requested by: No referring provider defined for this encounter. PCP: [name], NP (Inactive)   Assessment & Plan: Visit Diagnoses:  1. Rotator cuff syndrome of right shoulder     Plan: Impression is right shoulder rotator cuff syndrome.  Subacromial injection repeated today.  Patient tolerated well.  She understands eventually she will likely need debridement as the injections are unlikely going to be a permanent solution.  Follow-Up Instructions: Return if symptoms worsen or fail to improve.   Orders:  No orders of the defined types were placed in this encounter.  No orders of the defined types were placed in this encounter.     Procedures: Large Joint Inj: R subacromial bursa on 01/31/2020 11:12 AM Indications: pain Details: 22 G needle  Arthrogram: No  Medications: 3 mL lidocaine 1 %; 3 mL bupivacaine 0.5 %; 40 mg methylPREDNISolone acetate 40 MG/ML Outcome: tolerated well, no immediate complications Consent was given by the patient. Patient was prepped and draped in the usual sterile fashion.       Clinical Data: No additional findings.   Subjective: Chief Complaint  Patient presents with  . Right Shoulder - Pain    Kellsie returns today for chronic right shoulder pain.  Previous cortisone injection was over 4 months ago which was subacromial.  She did well from this injection.  Her relief has worn off and would like to have another injection today.  Denies any changes.     Review of Systems   Objective: Vital Signs: There were no vitals taken for this visit.  Physical Exam  Ortho Exam Right shoulder exam is unchanged. Specialty Comments:  No specialty comments available.  Imaging: No results found.   PMFS History: Patient Active Problem List   Diagnosis Date Noted  . Rotator cuff syndrome of  right shoulder 01/31/2020  . 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 Obesity 02/26/2011  . HYPERTENSION 04/29/2007  . DEPRESSION, MAJOR, RECURRENT 04/09/2006  . ANXIETY 04/09/2006  . 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 HYPERTENSION, BENIGN SYSTEMIC 04/09/2006  . GASTROESOPHAGEAL REFLUX, NO ESOPHAGITIS 04/09/2006  . MENOPAUSAL SYNDROME 04/09/2006  . BACK PAIN, LOW 04/09/2006  . INSOMNIA NOS 04/09/2006   Past Medical History:  Diagnosis Date  . Anxiety   . Degenerative disc disease   . Depression   . GERD (gastroesophageal reflux disease)   . Hyperlipidemia   . Hypertension   . PONV (postoperative nausea and vomiting)     Family History  Problem Relation Age of Onset  . Heart disease Father   . COPD Father     Past Surgical History:  Procedure Laterality Date  . ABDOMINAL HYSTERECTOMY  1986  . CHOLECYSTECTOMY  1995?  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 INCONTINENCE SURGERY  1996   Social History   Occupational History  . Not on file  Tobacco Use  . Smoking status: Never Smoker  . Smokeless tobacco: Never Used  Substance and Sexual Activity  . Alcohol use: No  . Drug use: No  . Sexual activity: Not on file

## 2020-03-16 ENCOUNTER — Telehealth: Payer: Self-pay

## 2020-03-16 NOTE — Telephone Encounter (Signed)
Patient called she has a few questions about her shoulder/ surgery she would like a call back:[phone]

## 2020-03-19 NOTE — Telephone Encounter (Signed)
Called patient no answer LMOM to return our call. What questions does she have?

## 2020-03-21 ENCOUNTER — Telehealth: Payer: Self-pay | Admitting: Orthopaedic Surgery

## 2020-03-21 NOTE — Telephone Encounter (Signed)
Patient called requesting a call back at [phone]. Patient has some medical questions for Marisue Ivan.

## 2020-03-21 NOTE — Telephone Encounter (Signed)
I called Tiffany Bean today at her request regarding her shoulder pain.  She has recently had a significant increase in there shoulder pain to the point that she is unable to perform her job or even do ADLs.  Her most recent subacromial injection was in mid December.  At this point, given failure of conservative treatment, I have recommended arthroscopic debridement and likely rotator cuff repair with SAD in the near future.  We went over risks and benefits, rehab and recovery of the surgery.  All questions answered.  She has elected to move forward with surgical treatment in the near future.  I will have Waldo Laine call her.

## 2020-03-21 NOTE — Telephone Encounter (Signed)
Please call patient to schedule surgery.  Right shoulder scope, RCR, SAD, extensive debridement.  [id number], [id number] [id number].  SCA or cone day.  General and block.  Arthrex.  Thanks.

## 2020-03-21 NOTE — Telephone Encounter (Signed)
Called patient back. States he is still having pain. Injection helped a lot. Pain started 2 weeks ago. Getting worse. Works at [employer]. Unable to do her job. Would like to discuss SU. Has several questions for Dr. Roda Shutters.  Please call patient. Thanks.   CB [phone]

## 2020-03-27 NOTE — Telephone Encounter (Signed)
I called patient and scheduled surgery. 

## 2020-04-04 ENCOUNTER — Other Ambulatory Visit: Payer: Self-pay

## 2020-04-16 ENCOUNTER — Other Ambulatory Visit: Payer: Self-pay

## 2020-04-16 ENCOUNTER — Encounter (HOSPITAL_BASED_OUTPATIENT_CLINIC_OR_DEPARTMENT_OTHER): Payer: Self-pay | Admitting: Orthopaedic Surgery

## 2020-04-16 NOTE — Progress Notes (Signed)

## 2020-04-17 ENCOUNTER — Other Ambulatory Visit (HOSPITAL_COMMUNITY): Payer: 59

## 2020-04-20 ENCOUNTER — Encounter (HOSPITAL_BASED_OUTPATIENT_CLINIC_OR_DEPARTMENT_OTHER): Payer: Self-pay | Admitting: Orthopaedic Surgery

## 2020-04-20 ENCOUNTER — Encounter (HOSPITAL_BASED_OUTPATIENT_CLINIC_OR_DEPARTMENT_OTHER)
Admission: RE | Disposition: A | Discharge status: Home / Self Care | Payer: Self-pay | Source: Home / Self Care | Attending: Orthopaedic Surgery

## 2020-04-20 ENCOUNTER — Encounter: Payer: Self-pay | Admitting: Orthopaedic Surgery

## 2020-04-20 ENCOUNTER — Other Ambulatory Visit: Payer: Self-pay

## 2020-04-20 ENCOUNTER — Ambulatory Visit (HOSPITAL_BASED_OUTPATIENT_CLINIC_OR_DEPARTMENT_OTHER): Payer: 59 | Admitting: Certified Registered"

## 2020-04-20 ENCOUNTER — Ambulatory Visit (HOSPITAL_BASED_OUTPATIENT_CLINIC_OR_DEPARTMENT_OTHER)
Admission: RE | Admit: 2020-04-20 | Discharge: 2020-04-20 | Disposition: A | Discharge status: Home / Self Care | Payer: 59 | Attending: Orthopaedic Surgery | Admitting: Orthopaedic Surgery

## 2020-04-20 DIAGNOSIS — Z885 Allergy status to narcotic agent status: Secondary | ICD-10-CM | POA: Diagnosis not present

## 2020-04-20 DIAGNOSIS — M7541 Impingement syndrome of right shoulder: Secondary | ICD-10-CM | POA: Diagnosis not present

## 2020-04-20 DIAGNOSIS — M75101 Unspecified rotator cuff tear or rupture of right shoulder, not specified as traumatic: Secondary | ICD-10-CM

## 2020-04-20 DIAGNOSIS — M75121 Complete rotator cuff tear or rupture of right shoulder, not specified as traumatic: Secondary | ICD-10-CM | POA: Diagnosis present

## 2020-04-20 DIAGNOSIS — Z79899 Other long term (current) drug therapy: Secondary | ICD-10-CM | POA: Diagnosis not present

## 2020-04-20 DIAGNOSIS — M719 Bursopathy, unspecified: Secondary | ICD-10-CM | POA: Diagnosis not present

## 2020-04-20 DIAGNOSIS — Z79890 Hormone replacement therapy (postmenopausal): Secondary | ICD-10-CM | POA: Diagnosis not present

## 2020-04-20 DIAGNOSIS — M94211 Chondromalacia, right shoulder: Secondary | ICD-10-CM | POA: Diagnosis not present

## 2020-04-20 DIAGNOSIS — X58XXXA Exposure to other specified factors, initial encounter: Secondary | ICD-10-CM | POA: Diagnosis not present

## 2020-04-20 DIAGNOSIS — S43431A Superior glenoid labrum lesion of right shoulder, initial encounter: Secondary | ICD-10-CM | POA: Diagnosis not present

## 2020-04-20 HISTORY — PX: SHOULDER ARTHROSCOPY WITH ROTATOR CUFF REPAIR AND SUBACROMIAL DECOMPRESSION: SHX5686

## 2020-04-20 HISTORY — DX: Anemia, unspecified: D64.9

## 2020-04-20 SURGERY — SHOULDER ARTHROSCOPY WITH ROTATOR CUFF REPAIR AND SUBACROMIAL DECOMPRESSION
Anesthesia: Regional | Site: Shoulder | Laterality: Right

## 2020-04-20 MED ORDER — FENTANYL CITRATE (PF) 100 MCG/2ML IJ SOLN
INTRAMUSCULAR | Status: AC
  Filled 2020-04-20: qty 2

## 2020-04-20 MED ORDER — PROPOFOL 10 MG/ML IV BOLUS
INTRAVENOUS | Status: AC
  Filled 2020-04-20: qty 40

## 2020-04-20 MED ORDER — BUPIVACAINE HCL (PF) 0.5 % IJ SOLN
INTRAMUSCULAR | Status: DC | PRN
  Administered 2020-04-20: 15 mL via PERINEURAL

## 2020-04-20 MED ORDER — FENTANYL CITRATE (PF) 100 MCG/2ML IJ SOLN: 25.0000 ug | INTRAMUSCULAR | Status: DC | PRN

## 2020-04-20 MED ORDER — MIDAZOLAM HCL 2 MG/2ML IJ SOLN
2.0000 mg | Freq: Once | INTRAMUSCULAR | Status: AC
  Administered 2020-04-20: 2 mg via INTRAVENOUS

## 2020-04-20 MED ORDER — PROPOFOL 10 MG/ML IV BOLUS
INTRAVENOUS | Status: DC | PRN
  Administered 2020-04-20: 25 ug/kg/min via INTRAVENOUS
  Administered 2020-04-20: 150 mg via INTRAVENOUS

## 2020-04-20 MED ORDER — LIDOCAINE 2% (20 MG/ML) 5 ML SYRINGE
INTRAMUSCULAR | Status: AC
  Filled 2020-04-20: qty 5

## 2020-04-20 MED ORDER — EPHEDRINE SULFATE 50 MG/ML IJ SOLN
INTRAMUSCULAR | Status: DC | PRN
  Administered 2020-04-20 (×5): 10 mg via INTRAVENOUS

## 2020-04-20 MED ORDER — EPHEDRINE 5 MG/ML INJ
INTRAVENOUS | Status: AC
  Filled 2020-04-20: qty 10

## 2020-04-20 MED ORDER — DEXAMETHASONE SODIUM PHOSPHATE 10 MG/ML IJ SOLN
INTRAMUSCULAR | Status: DC | PRN
  Administered 2020-04-20: 8 mg via INTRAVENOUS

## 2020-04-20 MED ORDER — ACETAMINOPHEN 10 MG/ML IV SOLN: 1000.0000 mg | Freq: Once | INTRAVENOUS | Status: DC | PRN

## 2020-04-20 MED ORDER — DEXAMETHASONE SODIUM PHOSPHATE 10 MG/ML IJ SOLN
INTRAMUSCULAR | Status: AC
  Filled 2020-04-20: qty 1

## 2020-04-20 MED ORDER — AMISULPRIDE (ANTIEMETIC) 5 MG/2ML IV SOLN
INTRAVENOUS | Status: AC
  Filled 2020-04-20: qty 4

## 2020-04-20 MED ORDER — EPHEDRINE 5 MG/ML INJ
INTRAVENOUS | Status: AC
  Filled 2020-04-20: qty 20

## 2020-04-20 MED ORDER — SUGAMMADEX SODIUM 200 MG/2ML IV SOLN
INTRAVENOUS | Status: DC | PRN
  Administered 2020-04-20: 200 mg via INTRAVENOUS

## 2020-04-20 MED ORDER — SODIUM CHLORIDE 0.9 % IR SOLN
Status: DC | PRN
  Administered 2020-04-20: 16000 mL

## 2020-04-20 MED ORDER — FENTANYL CITRATE (PF) 100 MCG/2ML IJ SOLN
50.0000 ug | Freq: Once | INTRAMUSCULAR | Status: AC
  Administered 2020-04-20: 50 ug via INTRAVENOUS

## 2020-04-20 MED ORDER — ONDANSETRON HCL 4 MG/2ML IJ SOLN
INTRAMUSCULAR | Status: DC | PRN
  Administered 2020-04-20: 4 mg via INTRAVENOUS

## 2020-04-20 MED ORDER — LACTATED RINGERS IV SOLN: INTRAVENOUS | Status: DC

## 2020-04-20 MED ORDER — ROCURONIUM BROMIDE 100 MG/10ML IV SOLN
INTRAVENOUS | Status: DC | PRN
  Administered 2020-04-20: 70 mg via INTRAVENOUS

## 2020-04-20 MED ORDER — FENTANYL CITRATE (PF) 100 MCG/2ML IJ SOLN
INTRAMUSCULAR | Status: DC | PRN
  Administered 2020-04-20: 50 ug via INTRAVENOUS

## 2020-04-20 MED ORDER — PROMETHAZINE HCL 25 MG/ML IJ SOLN
6.2500 mg | INTRAMUSCULAR | Status: DC | PRN
Start: 2020-04-20 — End: 2020-04-20

## 2020-04-20 MED ORDER — MIDAZOLAM HCL 2 MG/2ML IJ SOLN
INTRAMUSCULAR | Status: AC
  Filled 2020-04-20: qty 2

## 2020-04-20 MED ORDER — AMISULPRIDE (ANTIEMETIC) 5 MG/2ML IV SOLN
10.0000 mg | Freq: Once | INTRAVENOUS | Status: AC | PRN
  Administered 2020-04-20: 10 mg via INTRAVENOUS

## 2020-04-20 MED ORDER — CEFAZOLIN SODIUM-DEXTROSE 2-4 GM/100ML-% IV SOLN
2.0000 g | INTRAVENOUS | Status: AC
  Administered 2020-04-20: 2 g via INTRAVENOUS

## 2020-04-20 MED ORDER — PHENYLEPHRINE 40 MCG/ML (10ML) SYRINGE FOR IV PUSH (FOR BLOOD PRESSURE SUPPORT)
PREFILLED_SYRINGE | INTRAVENOUS | Status: AC
  Filled 2020-04-20: qty 10

## 2020-04-20 MED ORDER — HYDROCODONE-ACETAMINOPHEN 7.5-325 MG PO TABS: 1.0000 | ORAL_TABLET | Freq: Three times a day (TID) | ORAL | 0 refills | Status: DC | PRN

## 2020-04-20 MED ORDER — CEFAZOLIN SODIUM-DEXTROSE 2-4 GM/100ML-% IV SOLN
INTRAVENOUS | Status: AC
  Filled 2020-04-20: qty 100

## 2020-04-20 MED ORDER — BUPIVACAINE LIPOSOME 1.3 % IJ SUSP
INTRAMUSCULAR | Status: DC | PRN
  Administered 2020-04-20: 10 mL via PERINEURAL

## 2020-04-20 MED ORDER — BUPIVACAINE-EPINEPHRINE 0.25% -1:200000 IJ SOLN
INTRAMUSCULAR | Status: DC | PRN
  Administered 2020-04-20: 30 mL

## 2020-04-20 MED ORDER — HYDROMORPHONE HCL 2 MG PO TABS: 2.0000 mg | ORAL_TABLET | ORAL | 0 refills | Status: DC | PRN

## 2020-04-20 MED ORDER — METHOCARBAMOL 750 MG PO TABS: 750.0000 mg | ORAL_TABLET | Freq: Two times a day (BID) | ORAL | 3 refills | Status: AC | PRN

## 2020-04-20 SURGICAL SUPPLY — 68 items
ANCH SUT SWLK 24.5 SLF PNCH VT (Anchor) ×2 IMPLANT
ANCHOR BIOCOMP SWIVELOCK (Anchor) ×2 IMPLANT
APL SKNCLS STERI-STRIP NONHPOA (GAUZE/BANDAGES/DRESSINGS)
BENZOIN TINCTURE PRP APPL 2/3 (GAUZE/BANDAGES/DRESSINGS) IMPLANT
BLADE EXCALIBUR 4.0X13 (MISCELLANEOUS) ×1 IMPLANT
BLADE SURG 15 STRL LF DISP TIS (BLADE) IMPLANT
BLADE SURG 15 STRL SS (BLADE)
BURR OVAL 8 FLU 4.0X13 (MISCELLANEOUS) ×2 IMPLANT
CANNULA 5.75X71 LONG (CANNULA) ×2 IMPLANT
CANNULA SHOULDER 7CM (CANNULA) ×2 IMPLANT
CANNULA TWIST IN 8.25X7CM (CANNULA) ×1 IMPLANT
CLSR STERI-STRIP ANTIMIC 1/2X4 (GAUZE/BANDAGES/DRESSINGS) IMPLANT
COOLER ICEMAN CLASSIC (MISCELLANEOUS) ×2 IMPLANT
COVER WAND RF STERILE (DRAPES) IMPLANT
DECANTER SPIKE VIAL GLASS SM (MISCELLANEOUS) IMPLANT
DISSECTOR  3.8MM X 13CM (MISCELLANEOUS)
DISSECTOR 3.8MM X 13CM (MISCELLANEOUS) ×1 IMPLANT
DRAPE IMP U-DRAPE 54X76 (DRAPES) ×2 IMPLANT
DRAPE INCISE IOBAN 66X45 STRL (DRAPES) ×2 IMPLANT
DRAPE STERI 35X30 U-POUCH (DRAPES) ×2 IMPLANT
DRAPE SURG 17X23 STRL (DRAPES) ×2 IMPLANT
DRAPE U-SHAPE 47X51 STRL (DRAPES) ×2 IMPLANT
DRAPE U-SHAPE 76X120 STRL (DRAPES) ×4 IMPLANT
DRSG PAD ABDOMINAL 8X10 ST (GAUZE/BANDAGES/DRESSINGS) ×4 IMPLANT
DURAPREP 26ML APPLICATOR (WOUND CARE) ×3 IMPLANT
ELECT REM PT RETURN 9FT ADLT (ELECTROSURGICAL)
ELECTRODE REM PT RTRN 9FT ADLT (ELECTROSURGICAL) IMPLANT
GAUZE SPONGE 4X4 12PLY STRL (GAUZE/BANDAGES/DRESSINGS) ×4 IMPLANT
GAUZE XEROFORM 1X8 LF (GAUZE/BANDAGES/DRESSINGS) ×2 IMPLANT
GLOVE BIOGEL M 6.5 STRL (GLOVE) ×1 IMPLANT
GLOVE SURG LTX SZ7 (GLOVE) ×3 IMPLANT
GLOVE SURG NEOP MICRO LF SZ7.5 (GLOVE) ×2 IMPLANT
GLOVE SURG SYN 7.5  E (GLOVE) ×4
GLOVE SURG SYN 7.5 E (GLOVE) ×2 IMPLANT
GLOVE SURG SYN 7.5 PF PI (GLOVE) ×1 IMPLANT
GLOVE SURG UNDER POLY LF SZ7 (GLOVE) ×3 IMPLANT
GOWN STRL REIN XL XLG (GOWN DISPOSABLE) ×3 IMPLANT
GOWN STRL REUS W/ TWL LRG LVL3 (GOWN DISPOSABLE) ×1 IMPLANT
GOWN STRL REUS W/ TWL XL LVL3 (GOWN DISPOSABLE) ×1 IMPLANT
GOWN STRL REUS W/TWL LRG LVL3 (GOWN DISPOSABLE) ×2
GOWN STRL REUS W/TWL XL LVL3 (GOWN DISPOSABLE)
IV NS IRRIG 3000ML ARTHROMATIC (IV SOLUTION) ×6 IMPLANT
MANIFOLD NEPTUNE II (INSTRUMENTS) ×2 IMPLANT
NDL SCORPION MULTI FIRE (NEEDLE) IMPLANT
NEEDLE SCORPION MULTI FIRE (NEEDLE) ×2 IMPLANT
PACK ARTHROSCOPY DSU (CUSTOM PROCEDURE TRAY) ×2 IMPLANT
PACK BASIN DAY SURGERY FS (CUSTOM PROCEDURE TRAY) ×2 IMPLANT
PAD COLD SHLDR UNI WRAP-ON (PAD) ×2
PAD COLD SHLDR WRAP-ON (PAD) ×1 IMPLANT
PAD COLD UNI WRAP-ON (PAD) IMPLANT
PAD ORTHO SHOULDER 7X19 LRG (SOFTGOODS) ×1 IMPLANT
PORT APPOLLO RF 90DEGREE MULTI (SURGICAL WAND) ×2 IMPLANT
SHEET MEDIUM DRAPE 40X70 STRL (DRAPES) ×2 IMPLANT
SLEEVE SCD COMPRESS KNEE MED (STOCKING) ×2 IMPLANT
SLING ARM FOAM STRAP LRG (SOFTGOODS) IMPLANT
SUT ETHILON 3 0 PS 1 (SUTURE) ×2 IMPLANT
SUT FIBERWIRE #2 38 T-5 BLUE (SUTURE)
SUT TIGER TAPE 7 IN WHITE (SUTURE) ×1 IMPLANT
SUTURE FIBERWR #2 38 T-5 BLUE (SUTURE) IMPLANT
SUTURE TAPE 1.3 40 TPR END (SUTURE) IMPLANT
SUTURE TAPE TIGERLINK 1.3MM BL (SUTURE) IMPLANT
SUTURETAPE 1.3 40 TPR END (SUTURE)
SUTURETAPE TIGERLINK 1.3MM BL (SUTURE)
SYR 50ML LL SCALE MARK (SYRINGE) ×2 IMPLANT
TAPE FIBER 2MM 7IN #2 BLUE (SUTURE) ×1 IMPLANT
TOWEL GREEN STERILE FF (TOWEL DISPOSABLE) ×2 IMPLANT
TUBE CONNECTING 20X1/4 (TUBING) ×2 IMPLANT
TUBING ARTHROSCOPY IRRIG 16FT (MISCELLANEOUS) ×2 IMPLANT

## 2020-04-20 NOTE — Discharge Instructions (Signed)
Post-operative patient instructions  Shoulder Arthroscopy   . Ice:  Place intermittent ice or cooler pack over your shoulder, 30 minutes on and 30 minutes off.  Continue this for the first 72 hours after surgery, then save ice for use after therapy sessions or on more active days.   . Weight:  You may NOT bear weight on your arm as your symptoms allow. . Motion:  Do not perform any shoulder range of motion.  You may remove the sling to straighten your elbow.   . Dressing:  Perform 1st dressing change at 2 days postoperative. A moderate amount of blood tinged drainage is to be expected.  So if you bleed through the dressing on the first or second day or if you have fevers, it is fine to change the dressing/check the wounds early and redress wound.  If it bleeds through again, or if the incisions are leaking frank blood, please call the office. May change dressing every 1-2 days thereafter to help watch wounds. Can purchase Tegaderm (or 78M Nexcare) water resistant dressings at local pharmacy / [facility]. . Shower:  Light shower is ok after 2 days.  Please take shower, NO bath. Recover with gauze and ace wrap to help keep wounds protected.   . Pain medication:  A narcotic pain medication has been prescribed.  Take as directed.  Typically you need narcotic pain medication more regularly during the first 3 to 5 days after surgery.  Decrease your use of the medication as the pain improves.  Narcotics can sometimes cause constipation, even after a few doses.  If you have problems with constipation, you can take an over the counter stool softener or light laxative.  If you have persistent problems, please notify your physician's office. Marland Kitchen Physical therapy: Additional activity guidelines to be provided by your physician or physical therapist at follow-up visits.  . Driving: Do not recommend driving x 2 weeks post surgical, especially if surgery performed on right side. Should not drive while taking narcotic  pain medications. It typically takes at least 2 weeks to restore sufficient neuromuscular function for normal reaction times for driving safety.  . Call [phone] for questions or problems. Evenings you will be forwarded to the hospital operator.  Ask for the orthopaedic physician on call. Please call if you experience:    o Redness, foul smelling, or persistent drainage from the surgical site  o worsening shoulder pain and swelling not responsive to medication  o any calf pain and or swelling of the lower leg  o temperatures greater than 101.5 F o other questions or concerns   Thank you for allowing [place] to be a part of your care.   Post Anesthesia Home Care Instructions  Activity: Get plenty of rest for the remainder of the day. A responsible individual must stay with you for 24 hours following the procedure.  For the next 24 hours, DO NOT: -Drive a car -Advertising copywriter -Drink alcoholic beverages -Take any medication unless instructed by your physician -Make any legal decisions or sign important papers.  Meals: Start with liquid foods such as gelatin or soup. Progress to regular foods as tolerated. Avoid greasy, spicy, heavy foods. If nausea and/or vomiting occur, drink only clear liquids until the nausea and/or vomiting subsides. Call your physician if vomiting continues.  Special Instructions/Symptoms: Your throat may feel dry or sore from the anesthesia or the breathing tube placed in your throat during surgery. If this causes discomfort, gargle with warm salt water.  The discomfort should disappear within 24 hours.  If you had a scopolamine patch placed behind your ear for the management of post- operative nausea and/or vomiting:  1. The medication in the patch is effective for 72 hours, after which it should be removed.  Wrap patch in a tissue and discard in the trash. Wash hands thoroughly with soap and water. 2. You may remove the patch earlier than 72 hours if you  experience unpleasant side effects which may include dry mouth, dizziness or visual disturbances. 3. Avoid touching the patch. Wash your hands with soap and water after contact with the patch.    Regional Anesthesia Blocks  1. Numbness or the inability to move the "blocked" extremity may last from 3-48 hours after placement. The length of time depends on the medication injected and your individual response to the medication. If the numbness is not going away after 48 hours, call your surgeon.  2. The extremity that is blocked will need to be protected until the numbness is gone and the  Strength has returned. Because you cannot feel it, you will need to take extra care to avoid injury. Because it may be weak, you may have difficulty moving it or using it. You may not know what position it is in without looking at it while the block is in effect.  3. For blocks in the legs and feet, returning to weight bearing and walking needs to be done carefully. You will need to wait until the numbness is entirely gone and the strength has returned. You should be able to move your leg and foot normally before you try and bear weight or walk. You will need someone to be with you when you first try to ensure you do not fall and possibly risk injury.  4. Bruising and tenderness at the needle site are common side effects and will resolve in a few days.  5. Persistent numbness or new problems with movement should be communicated to the surgeon or the [facility] [phone] [facility] [phone]).Information for Discharge Teaching: EXPAREL (bupivacaine liposome injectable suspension)   Your surgeon or anesthesiologist gave you EXPAREL(bupivacaine) to help control your pain after surgery.   EXPAREL is a local anesthetic that provides pain relief by numbing the tissue around the surgical site.  EXPAREL is designed to release pain medication over time and can control pain for up to 72  hours.  Depending on how you respond to EXPAREL, you may require less pain medication during your recovery.  Possible side effects:  Temporary loss of sensation or ability to move in the area where bupivacaine was injected.  Nausea, vomiting, constipation  Rarely, numbness and tingling in your mouth or lips, lightheadedness, or anxiety may occur.  Call your doctor right away if you think you may be experiencing any of these sensations, or if you have other questions regarding possible side effects.  Follow all other discharge instructions given to you by your surgeon or nurse. Eat a healthy diet and drink plenty of water or other fluids.  If you return to the hospital for any reason within 96 hours following the administration of EXPAREL, it is important for health care providers to know that you have received this anesthetic. A teal colored band has been placed on your arm with the date, time and amount of EXPAREL you have received in order to alert and inform your health care providers. Please leave this armband in place for the full 96  hours following administration, and then you may remove the band.

## 2020-04-20 NOTE — Anesthesia Procedure Notes (Signed)
Anesthesia Regional Block: Supraclavicular block   Pre-Anesthetic Checklist: ,, timeout performed, Correct Patient, Correct Site, Correct Laterality, Correct Procedure, Correct Position, site marked, Risks and benefits discussed,  Surgical consent,  Pre-op evaluation,  At surgeon's request and post-op pain management  Laterality: Right  Prep: chloraprep       Needles:  Injection technique: Single-shot  Needle Type: Echogenic Stimulator Needle     Needle Length: 10cm  Needle Gauge: 21     Additional Needles:   Procedures:,,,, ultrasound used (permanent image in chart),,,,  Narrative:  Start time: 04/20/2020 10:25 AM End time: 04/20/2020 10:30 AM Injection made incrementally with aspirations every 5 mL.  Performed by: Personally  Anesthesiologist: Mellody Dance, MD  Additional Notes: Functioning IV was confirmed and monitors applied.  Sterile prep and drape,hand hygiene and sterile gloves were used.Ultrasound guidance: relevant anatomy identified, needle position confirmed, local anesthetic spread visualized around nerve(s)., vascular puncture avoided.  Image printed for medical record.  Negative aspiration and negative test dose prior to incremental administration of local anesthetic. The patient tolerated the procedure well.

## 2020-04-20 NOTE — Anesthesia Postprocedure Evaluation (Signed)
Anesthesia Post Note  Patient: Tiffany Bean  Procedure(s) Performed: RIGHT SHOULDER ARTHROSCOPY WITH EXTENSIVE DEBRIDEMENT, ROTATOR CUFF REPAIR AND SUBACROMIAL DECOMPRESSION (Right Shoulder)     Patient location during evaluation: PACU Anesthesia Type: Regional and General Level of consciousness: awake and alert Pain management: pain level controlled Vital Signs Assessment: post-procedure vital signs reviewed and stable Respiratory status: spontaneous breathing, nonlabored ventilation and respiratory function stable Cardiovascular status: blood pressure returned to baseline and stable Postop Assessment: no apparent nausea or vomiting Anesthetic complications: no   No complications documented.  Last Vitals:  Vitals:   04/20/20 1445 04/20/20 1508  BP: 126/76 133/78  Pulse: 72 75  Resp: 17 18  Temp:  36.4 C  SpO2: 95% 99%    Last Pain:  Vitals:   04/20/20 1508  TempSrc:   PainSc: 0-No pain                 Toddrick Sanna,W. EDMOND     

## 2020-04-20 NOTE — Anesthesia Preprocedure Evaluation (Addendum)
Anesthesia Evaluation  Patient identified by MRN, date of birth, ID band Patient awake    Reviewed: Allergy & Precautions, NPO status , Patient's Chart, lab work & pertinent test results  History of Anesthesia Complications (+) PONV and history of anesthetic complications  Airway Mallampati: II  TM Distance: >3 FB Neck ROM: Full    Dental no notable dental hx.    Pulmonary neg pulmonary ROS,    Pulmonary exam normal breath sounds clear to auscultation       Cardiovascular Exercise Tolerance: Good hypertension, Normal cardiovascular exam Rhythm:Regular Rate:Normal     Neuro/Psych PSYCHIATRIC DISORDERS Anxiety Depression negative neurological ROS     GI/Hepatic Neg liver ROS, GERD  ,  Endo/Other  negative endocrine ROS  Renal/GU negative Renal ROS  negative genitourinary   Musculoskeletal  (+) Arthritis ,   Abdominal   Peds negative pediatric ROS (+)  Hematology negative hematology ROS (+)   Anesthesia Other Findings   Reproductive/Obstetrics negative OB ROS                            Anesthesia Physical Anesthesia Plan  ASA: II  Anesthesia Plan: Regional and General   Post-op Pain Management: GA combined w/ Regional for post-op pain   Induction: Intravenous  PONV Risk Score and Plan: 4 or greater and Ondansetron, Dexamethasone, Treatment may vary due to age or medical condition and Midazolam  Airway Management Planned: Oral ETT  Additional Equipment:   Intra-op Plan:   Post-operative Plan: Extubation in OR  Informed Consent: I have reviewed the patients History and Physical, chart, labs and discussed the procedure including the risks, benefits and alternatives for the proposed anesthesia with the patient or authorized representative who has indicated his/her understanding and acceptance.       Plan Discussed with: CRNA and Anesthesiologist  Anesthesia Plan Comments:         Anesthesia Quick Evaluation

## 2020-04-20 NOTE — Transfer of Care (Signed)
Immediate Anesthesia Transfer of Care Note  Patient: Tiffany Bean  Procedure(s) Performed: RIGHT SHOULDER ARTHROSCOPY WITH EXTENSIVE DEBRIDEMENT, ROTATOR CUFF REPAIR AND SUBACROMIAL DECOMPRESSION (Right Shoulder)  Patient Location: PACU  Anesthesia Type:General and Regional  Level of Consciousness: drowsy  Airway & Oxygen Therapy: Patient Spontanous Breathing and Patient connected to face mask oxygen  Post-op Assessment: Report given to RN and Post -op Vital signs reviewed and stable  Post vital signs: Reviewed and stable  Last Vitals:  Vitals Value Taken Time  BP 107/70 04/20/20 1409  Temp    Pulse 76 04/20/20 1414  Resp 16 04/20/20 1414  SpO2 99 % 04/20/20 1414  Vitals shown include unvalidated device data.  Last Pain:  Vitals:   04/20/20 1010  TempSrc: Oral  PainSc: 2       Patients Stated Pain Goal: 4 (04/20/20 1010)  Complications: No complications documented.

## 2020-04-20 NOTE — H&P (Signed)
PREOPERATIVE H&P  Chief Complaint: complete rotator cuff tear and impingement right shoulder  HPI: Tiffany Bean is a 60 y.o. female who presents for surgical treatment of complete rotator cuff tear and impingement right shoulder.  She denies any changes in medical history.  Past Medical History:  Diagnosis Date  . Anemia   . Anxiety   . Degenerative disc disease   . Depression   . GERD (gastroesophageal reflux disease)   . Hyperlipidemia   . Hypertension    no meds after losing weight  . PONV (postoperative nausea and vomiting)    Past Surgical History:  Procedure Laterality Date  . ABDOMINAL HYSTERECTOMY  1986  . CHOLECYSTECTOMY  1995?  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 INCONTINENCE SURGERY  1996   Social History   Socioeconomic History  . Marital status: Married    Spouse name: Not on file  . Number of children: Not on file  . Years of education: Not on file  . Highest education level: Not on file  Occupational History  . Not on file  Tobacco Use  . Smoking status: Never Smoker  . Smokeless tobacco: Never Used  Substance and Sexual Activity  . Alcohol use: No  . Drug use: No  . Sexual activity: Not on file  Other Topics Concern  . Not on file  Social History Narrative  . Not on file   Social Determinants of Health   Financial Resource Strain: Not on file  Food Insecurity: Not on file  Transportation Needs: Not on file  Physical Activity: Not on file  Stress: Not on file  Social Connections: Not on file   Family History  Problem Relation Age of Onset  . Heart disease Father   . COPD Father    Allergies  Allergen Reactions  . Codeine Other (See Comments)    Causes her to feel "loopy" will hallucinate.  . Oxycodone-Acetaminophen Other (See Comments)    hallucinations   Prior to Admission medications    Medication Sig Start Date End Date Taking? Authorizing Provider  Biotin 5000 MCG TABS Take 10,000 mcg by mouth daily.   Yes [provider]  buPROPion (WELLBUTRIN XL) 300 MG 24 hr tablet Take 300 mg by mouth every morning.    Yes [provider]  Cholecalciferol (VITAMIN D-3) 5000 UNITS TABS Take 1 tablet by mouth daily.   Yes [provider]  estradiol (ESTRACE) 1 MG tablet Take 1 mg by mouth daily.   Yes [provider]  FLUoxetine (PROZAC) 40 MG capsule Take 40 mg by mouth daily.   Yes [provider]  ibuprofen (ADVIL,MOTRIN) 200 MG tablet Take 400 mg by mouth every 6 (six) hours as needed for moderate pain.   Yes [provider]  ALPRAZolam Prudy Feeler) 0.5 MG tablet Take 0.5 mg by mouth at bedtime as needed for anxiety.    [provider]  valACYclovir (VALTREX) 500 MG tablet Take 500 mg by mouth as needed. For inflammation 03/31/11   [provider]  hydrochlorothiazide (HYDRODIURIL) 25 MG tablet Take 25 mg by mouth daily.  04/25/11  [provider]  phentermine (ADIPEX-P) 37.5 MG tablet Take 37.5 mg by mouth daily before breakfast.  04/30/11  [provider]     Positive ROS: All other systems have been reviewed and were otherwise negative with the exception  of those mentioned in the HPI and as above.  Physical Exam: General: Alert, no acute distress Cardiovascular: No pedal edema Respiratory: No cyanosis, no use of accessory musculature GI: abdomen soft Skin: No lesions in the area of chief complaint Neurologic: Sensation intact distally Psychiatric: Patient is competent for consent with normal mood and affect Lymphatic: no lymphedema  MUSCULOSKELETAL: exam stable  Assessment: complete rotator cuff tear and impingement right shoulder  Plan: Plan for Procedure(s): RIGHT SHOULDER ARTHROSCOPY WITH EXTENSIVE DEBRIDEMENT, ROTATOR CUFF REPAIR AND SUBACROMIAL DECOMPRESSION  The risks benefits and  alternatives were discussed with the patient including but not limited to the risks of nonoperative treatment, versus surgical intervention including infection, bleeding, nerve injury,  blood clots, cardiopulmonary complications, morbidity, mortality, among others, and they were willing to proceed.   Preoperative templating of the joint replacement has been completed, documented, and submitted to the Operating Room personnel in order to optimize intra-operative equipment management.   Glee Arvin, MD 04/20/2020 12:11 PM

## 2020-04-20 NOTE — Op Note (Signed)
   Date of Surgery: 04/20/2020  INDICATIONS: The patient is a 60 year old female with right shoulder pain that has failed conservative treatment;  The patient did consent to the procedure after discussion of the risks and benefits.  PREOPERATIVE DIAGNOSIS:  1.  Full-thickness tear right supraspinatus 5 mm retraction 2.  Right degenerative SLAP tear 3.  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  Right shoulder impingement syndrome  POSTOPERATIVE DIAGNOSIS: Same.  PROCEDURE:  1.  Arthroscopic repair of the right supraspinatus tendon 2.  Arthroscopic right shoulder extensive debridement of labrum, rotator cuff, biceps tenotomy, bursitis 3.  Arthroscopic subacromial decompression with acromioplasty  SURGEON: N. Glee Arvin, M.D.  ASSIST: Oneal Grout, PA-C  ANESTHESIA:  general, regional  IV FLUIDS AND URINE: See anesthesia.  ESTIMATED BLOOD LOSS: minimal mL.  IMPLANTS: Arthrex swivel lock 4.75 mm x 2  COMPLICATIONS: None.  DESCRIPTION OF PROCEDURE: The patient was brought to the operating room and placed supine on the operating table.  The patient had been signed prior to the procedure and this was documented. The patient had the anesthesia placed by the anesthesiologist.  A time-out was performed to confirm that this was the correct patient, site, side and location. The patient did receive antibiotics prior to the incision and was re-dosed during the procedure as needed at indicated intervals.  The patient was then positioned into the beach chair position with all bony prominences well padded and neutral C spine. The patient had the operative extremity prepped and draped in the standard surgical fashion.    Incisions were made for arthroscopic shoulder portals.  Diagnostic shoulder arthroscopy was first performed.  We found generalized grade II-III chondromalacia of the glenohumeral joint.  There is no unstable flaps of cartilage.  There was degenerative fraying of the  anterior labrum and posterior labrum.  There is a Buford complex.  There is a type II degenerative SLAP tear.  Biceps tenotomy was performed.  Section of the superior labrum was detached with the biceps to help minimize risk of Popeye deformity.  Once this was done we then reposition the arthroscope into the subacromial space.  The bursitis was debrided to expose the rotator cuff.  The supraspinatus tendon was extremely friable and was easily debrided away with an oscillating shaver.  I debrided the tendon back to stable and healthy tissue.  Subacromial decompression with acromioplasty was then performed using a high-speed bur.  The bone at the greater tuberosity and humeral head was also prepared with the high-speed bur.  Horizontal mattress single row in a crisscross fashion was used to repair the supraspinatus tendon into 2 swivel locks each with excellent fixation in the proximal humerus.  Excess fluid was removed from the shoulder joint.  Incisions closed with interrupted nylon sutures.  Shoulder immobilizer with abduction pillow was placed.  Patient tolerated procedure well had no me complications. Tessa Lerner, my PA, was a medical necessity for the entirety of the surgery including opening, closing, limb positioning, retracting, exposing, and repairing.  POSTOPERATIVE PLAN: Patient will remain in the shoulder immobilizer for 6 weeks.  She will follow up in the office in about 7 to 10 days for suture removal which point she will begin outpatient physical therapy.  Mayra Reel, MD [facility] [phone] 1:53 PM

## 2020-04-20 NOTE — Progress Notes (Signed)
AssistedDr. Bass with right, ultrasound guided, interscalene  block. Side rails up, monitors on throughout procedure. See vital signs in flow sheet. Tolerated Procedure well.  

## 2020-04-20 NOTE — Anesthesia Procedure Notes (Signed)
Procedure Name: Intubation Date/Time: 04/20/2020 12:24 PM Performed by: Ezequiel Kayser, CRNA Pre-anesthesia Checklist: Patient identified, Emergency Drugs available, Suction available and Patient being monitored Patient Re-evaluated:Patient Re-evaluated prior to induction Oxygen Delivery Method: Circle System Utilized Preoxygenation: Pre-oxygenation with 100% oxygen Induction Type: IV induction Ventilation: Mask ventilation without difficulty Laryngoscope Size: Mac and 3 Grade View: Grade I Tube type: Oral Tube size: 7.0 mm Number of attempts: 1 Airway Equipment and Method: Bite block Placement Confirmation: positive ETCO2 Secured at: 22 cm Tube secured with: Tape Dental Injury: Teeth and Oropharynx as per pre-operative assessment  Comments: Eyes taped prior to mask ventilation

## 2020-04-20 NOTE — Anesthesia Postprocedure Evaluation (Signed)
Anesthesia Post Note  Patient: Mayela Bullard [facility]  Procedure(s) Performed: RIGHT SHOULDER ARTHROSCOPY WITH EXTENSIVE DEBRIDEMENT, ROTATOR CUFF REPAIR AND SUBACROMIAL DECOMPRESSION (Right Shoulder)     Patient location during evaluation: PACU Anesthesia Type: Regional and General Level of consciousness: awake and alert Pain management: pain level controlled Vital Signs Assessment: post-procedure vital signs reviewed and stable Respiratory status: spontaneous breathing, nonlabored ventilation and respiratory function stable Cardiovascular status: blood pressure returned to baseline and stable Postop Assessment: no apparent nausea or vomiting Anesthetic complications: no   No complications documented.  Last Vitals:  Vitals:   04/20/20 1445 04/20/20 1508  BP: 126/76 133/78  Pulse: 72 75  Resp: 17 18  Temp:  36.4 C  SpO2: 95% 99%    Last Pain:  Vitals:   04/20/20 1508  TempSrc:   PainSc: 0-No pain                 Demarr Kluever,W. EDMOND

## 2020-04-23 ENCOUNTER — Encounter (HOSPITAL_BASED_OUTPATIENT_CLINIC_OR_DEPARTMENT_OTHER): Payer: Self-pay | Admitting: Orthopaedic Surgery

## 2020-04-26 ENCOUNTER — Other Ambulatory Visit: Payer: Self-pay

## 2020-04-26 ENCOUNTER — Ambulatory Visit (INDEPENDENT_AMBULATORY_CARE_PROVIDER_SITE_OTHER): Payer: 59 | Admitting: Physician Assistant

## 2020-04-26 ENCOUNTER — Encounter: Payer: Self-pay | Admitting: Physician Assistant

## 2020-04-26 DIAGNOSIS — Z9889 Other specified postprocedural states: Secondary | ICD-10-CM

## 2020-04-26 MED ORDER — HYDROCODONE-ACETAMINOPHEN 7.5-325 MG PO TABS: 1.0000 | ORAL_TABLET | Freq: Three times a day (TID) | ORAL | 0 refills | Status: AC | PRN

## 2020-04-26 MED ORDER — HYDROMORPHONE HCL 2 MG PO TABS: 2.0000 mg | ORAL_TABLET | ORAL | 0 refills | Status: AC | PRN

## 2020-04-26 NOTE — Progress Notes (Signed)
Post-Op Visit Note   Patient: Tiffany Bean           Date of Birth: 06/28/1960           MRN: [id number] Visit Date: 04/26/2020 PCP: Pearson Grippe, MD   Assessment & Plan:  Chief Complaint:  Chief Complaint  Patient presents with  . Right Shoulder - Pain   Visit Diagnoses:  1. S/P arthroscopy of right shoulder   2. S/P right rotator cuff repair     Plan: Patient is a very pleasant 60 year old female who comes in today 1 week out right shoulder arthroscopic debridement, decompression, biceps tenotomy and rotator cuff repair.  She has been doing well.  She has been compliant wearing her sling with abduction pillow.  She has been in a moderate amount of pain which is relieved with her narcotic pain medication.  Examination of her right shoulder reveals well healed surgical portals with nylon sutures in place.  No evidence of infection or cellulitis.  Fingers are warm and well-perfused.  Today, sutures were removed and Steri-Strips applied.  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  I refilled her medication.  We will go ahead and start her in physical therapy with passive range of motion only.  Continue wearing the sling and abduction pillow for another 5 weeks time.  Follow-up with [place] in 5 weeks for recheck.  Call with concerns or questions.  Follow-Up Instructions: Return in about 5 weeks (around 05/31/2020).   Orders:  Orders Placed This Encounter  Procedures  . Ambulatory referral to Physical Therapy   Meds ordered this encounter  Medications  . HYDROmorphone (DILAUDID) 2 MG tablet    Sig: Take 1 tablet (2 mg total) by mouth every 4 (four) hours as needed for severe pain.    Dispense:  10 tablet    Refill:  0  . HYDROcodone-acetaminophen (NORCO) 7.5-325 MG tablet    Sig: Take 1-2 tablets by mouth 3 (three) times daily as needed for moderate pain.    Dispense:  30 tablet    Refill:  0    Imaging: No new imaging  PMFS  History: Patient Active Problem List   Diagnosis Date Noted  . Tear of right supraspinatus tendon 04/20/2020  . Impingement syndrome of right shoulder 04/20/2020  . Degenerative superior labral anterior-to-posterior (SLAP) tear of right shoulder 04/20/2020  . Rotator cuff syndrome of right shoulder 01/31/2020  . 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 Obesity 02/26/2011  . HYPERTENSION 04/29/2007  . DEPRESSION, MAJOR, RECURRENT 04/09/2006  . ANXIETY 04/09/2006  . 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 HYPERTENSION, BENIGN SYSTEMIC 04/09/2006  . GASTROESOPHAGEAL REFLUX, NO ESOPHAGITIS 04/09/2006  . MENOPAUSAL SYNDROME 04/09/2006  . BACK PAIN, LOW 04/09/2006  . INSOMNIA NOS 04/09/2006   Past Medical History:  Diagnosis Date  . Anemia   . Anxiety   . Degenerative disc disease   . Depression   . GERD (gastroesophageal reflux disease)   . Hyperlipidemia   . Hypertension    no meds after losing weight  . PONV (postoperative nausea and vomiting)     Family History  Problem Relation Age of Onset  . Heart disease Father   . COPD Father     Past Surgical History:  Procedure Laterality Date  . ABDOMINAL HYSTERECTOMY  1986  . CHOLECYSTECTOMY  1995?  Marland Kitchen GASTRIC ROUX-EN-Y  05/12/2011   Procedure: LAPAROSCOPIC ROUX-EN-Y GASTRIC;  Surgeon: Thornton Park  Daphine Deutscher, MD;  Location: [facility];  Service: General;  Laterality: N/A;  . INCONTINENCE SURGERY  1996  . SHOULDER ARTHROSCOPY WITH ROTATOR CUFF REPAIR AND SUBACROMIAL DECOMPRESSION Right 04/20/2020   Procedure: RIGHT SHOULDER ARTHROSCOPY WITH EXTENSIVE DEBRIDEMENT, ROTATOR CUFF REPAIR AND SUBACROMIAL DECOMPRESSION;  Surgeon: Tarry Kos, MD;  Location: [facility];  Service: Orthopedics;  Laterality: Right;   Social History   Occupational History  . Not on file  Tobacco Use  . Smoking status: Never Smoker  . Smokeless tobacco: Never Used  Substance and Sexual Activity  . Alcohol use: No  . Drug use: No  . Sexual  activity: Not on file

## 2020-04-27 ENCOUNTER — Telehealth: Payer: Self-pay | Admitting: Physician Assistant

## 2020-04-27 NOTE — Telephone Encounter (Signed)
Patient called. She would like a note to return back to work on 05/01/2020. Her email is [email or web address]

## 2020-04-30 NOTE — Telephone Encounter (Signed)
Ok to do.  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

## 2020-05-02 NOTE — Telephone Encounter (Signed)
Emailed

## 2020-05-10 ENCOUNTER — Ambulatory Visit: Payer: 59 | Admitting: Physical Therapy

## 2020-05-11 ENCOUNTER — Other Ambulatory Visit: Payer: Self-pay

## 2020-05-11 ENCOUNTER — Encounter: Payer: Self-pay | Admitting: Rehabilitative and Restorative Service Providers"

## 2020-05-11 ENCOUNTER — Ambulatory Visit: Payer: 59 | Admitting: Rehabilitative and Restorative Service Providers"

## 2020-05-11 DIAGNOSIS — R600 Localized edema: Secondary | ICD-10-CM

## 2020-05-11 DIAGNOSIS — M6281 Muscle weakness (generalized): Secondary | ICD-10-CM | POA: Diagnosis not present

## 2020-05-11 DIAGNOSIS — M25511 Pain in right shoulder: Secondary | ICD-10-CM

## 2020-05-11 NOTE — Patient Instructions (Signed)
Access Code: [email or web address] URL: [email or web address] Date: 05/11/2020 Prepared by: Chyrel Masson  Exercises Seated Scapular Retraction - 2-3 x daily - 7 x weekly - 2 sets - 10 reps - 5 hold Horizontal Shoulder Pendulum with Table Support - 2-3 x daily - 7 x weekly - 1 sets - 20 reps Flexion-Extension Shoulder Pendulum with Table Support - 2-3 x daily - 7 x weekly - 1 sets - 20 reps Circular Shoulder Pendulum with Table Support - 2-3 x daily - 7 x weekly - 1 sets - 20 reps

## 2020-05-11 NOTE — Therapy (Signed)
[facility] [address] [place], [place], [address] Phone: [phone]   Fax:  [phone]  Physical Therapy Evaluation  Patient Details  Name: Tiffany Bean MRN: [id number] Date of Birth: 10/21/1960 Referring Provider (PT): Jari Sportsman PA-C   Encounter Date: 05/11/2020   PT End of Session - 05/11/20 1010    Visit Number 1    Number of Visits 20    Date for PT Re-Evaluation 07/20/20    Progress Note Due on Visit 10    PT Start Time 1015    PT Stop Time 1049    PT Time Calculation (min) 34 min    Activity Tolerance Patient tolerated treatment well    Behavior During Therapy [facility] for tasks assessed/performed           Past Medical History:  Diagnosis Date  . Anemia   . Anxiety   . Degenerative disc disease   . Depression   . GERD (gastroesophageal reflux disease)   . Hyperlipidemia   . Hypertension    no meds after losing weight  . PONV (postoperative nausea and vomiting)     Past Surgical History:  Procedure Laterality Date  . ABDOMINAL HYSTERECTOMY  1986  . CHOLECYSTECTOMY  1995?  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 INCONTINENCE SURGERY  1996  . SHOULDER ARTHROSCOPY WITH ROTATOR CUFF REPAIR AND SUBACROMIAL DECOMPRESSION Right 04/20/2020   Procedure: RIGHT SHOULDER ARTHROSCOPY WITH EXTENSIVE DEBRIDEMENT, ROTATOR CUFF REPAIR AND SUBACROMIAL DECOMPRESSION;  Surgeon: Tarry Kos, MD;  Location: [facility];  Service: Orthopedics;  Laterality: Right;    There were no vitals filed for this visit.    Subjective Assessment - 05/11/20 1019    Subjective Surgery 04/20/2020 SAD, biceps tenotomy, RTC repair  PROM only per last visit.  Sling/pillow until return to MD office    Limitations Lifting;House hold activities;Other (comment)   work   Patient Stated Goals Reduce pain, work(lift  kayaks, rescue boat operation), housework, yard work return    Currently in Pain? Yes    Pain Score 0-No pain   pain at worst 4/10   Pain Location Shoulder    Pain Orientation Right;Upper;Anterior    Pain Descriptors / Indicators Sharp;Throbbing    Pain Type Surgical pain    Pain Onset 1 to 4 weeks ago    Pain Frequency Intermittent    Aggravating Factors  moving Rt arm, lying on arm    Pain Relieving Factors icing, medicine    Effect of Pain on Daily Activities Limited in work, all Rt arm use              [facility] PT Assessment - 05/11/20 0001      Assessment   Medical Diagnosis S/P Rt shoulder RTC, biceps tenotomy, SAD    Referring Provider (PT) Jari Sportsman PA-C    Onset Date/Surgical Date 04/20/20    Hand Dominance Right      Precautions   Precautions Shoulder    Shoulder Interventions Shoulder sling/immobilizer   until return to MD visit   Precaution Comments PROM per provider note      Balance Screen   Has the patient fallen in the past 6 months No    Has the patient had a decrease in activity level because of a fear of falling?  No    Is the patient reluctant to leave  their home because of a fear of falling?  No      Home Environment   Additional Comments has stairs but not required.  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    Leisure [place] work, housework      Cognition   Overall Cognitive Status Within [employer] OneFunctional Limits for tasks assessed      Observation/Other Assessments   Focus on Therapeutic Outcomes (FOTO)  intake 45%, outcome prediced 65%      ROM / Strength   AROM / PROM / Strength Strength;PROM;AROM      AROM   Overall AROM Comments No AROM testing at this time on Rt UE.  Lt UE WFL at this time    AROM Assessment Site Shoulder    Right/Left Shoulder Right;Left      PROM   Overall PROM Comments all measured in  supine    PROM Assessment Site Shoulder    Right/Left Shoulder Left;Right    Right Shoulder Flexion 130 Degrees    Right Shoulder ABduction 110 Degrees    Right Shoulder Internal Rotation 65 Degrees   supine in 45 deg abd   Right Shoulder External Rotation 20 Degrees   supine 45 deg abd positioning     Strength   Overall Strength Comments No MMT at this time on Rt UE    Strength Assessment Site Shoulder;Elbow    Right/Left Shoulder Left;Right    Left Shoulder Flexion 5/5    Left Shoulder ABduction 5/5    Left Shoulder Internal Rotation 5/5    Left Shoulder External Rotation 5/5      Palpation   Palpation comment Passive Jt mobility Rt GH jt normal in early to mid ranges, no complaints                      Objective measurements completed on examination: See above findings.       [facility] Adult PT Treatment/Exercise - 05/11/20 0001      Exercises   Exercises Other Exercises;Shoulder    Other Exercises  HEP instruction/performance c cues for techniques, handout provided.  Trial set performed of each for comprehension and symptom assessment.  Included standing and sitting pendulums, scap retraction and grip squeeze.      Manual Therapy   Manual therapy comments g2-g3 inferior jt mobs, ap mobs Rt GH Jt                  PT Education - 05/11/20 1010    Education Details HEP, POC, protocol for PROM    Person(s) Educated Patient    Methods Explanation;Demonstration;Verbal cues;Handout    Comprehension Returned demonstration;Verbalized understanding            PT Short Term Goals - 05/11/20 1010      PT SHORT TERM GOAL #1   Title Patient will demonstrate independent use of home exercise program to maintain progress from in clinic treatments.    Time 3    Period Weeks    Status New    Target Date 06/01/20             PT Long Term Goals - 05/11/20 1010      PT LONG TERM GOAL #1   Title Patient will demonstrate/report pain at worst less than or  equal to 2/10 to facilitate minimal  limitation in daily activity secondary to pain symptoms.    Time 10    Period Weeks    Status New    Target Date 07/20/20      PT LONG TERM GOAL #2   Title Patient will demonstrate independent use of home exercise program to facilitate ability to maintain/progress functional gains from skilled physical therapy services.    Time 10    Period Weeks    Status New    Target Date 07/20/20      PT LONG TERM GOAL #3   Title Patient will demonstrate  Rt GH joint mobility WFL to facilitate usual self care, dressing, reaching overhead at PLOF s limitation due to symptoms.    Time 10    Period Weeks    Status New    Target Date 07/20/20      PT LONG TERM GOAL #4   Title Patient will demonstrate Rt UE MMT 5/5 throughout to facilitate usual lifting, carrying in functional activity to PLOF s limitation.    Time 10    Period Weeks    Status New    Target Date 07/20/20      PT LONG TERM GOAL #5   Title Pt. will demonstrate FOTO outocme > or = 65 % to indicated reduced disability due to condition.    Time 10    Period Weeks    Status New    Target Date 07/20/20                  Plan - 05/11/20 1012    Clinical Impression Statement Patient is a 60 y.o. who comes to clinic with complaints of  Rt shoulder pain s/p Rt shoulder SAD, biceps tenotomy, RTC repair with mobility, strength and movement coordination deficits that impair their ability to perform usual daily and recreational functional activities without increase difficulty/symptoms at this time.  Patient to benefit from skilled PT services to address impairments and limitations to improve to previous level of function without restriction secondary to condition.    Examination-Activity Limitations Sleep;Bend;Bed Mobility;Bathing;Carry;Dressing;Hygiene/Grooming;Lift;Reach Overhead    Examination-Participation Restrictions Cleaning;Community Activity;Driving;Laundry;Meal Prep;Yard Work     Stability/Clinical Decision Making Stable/Uncomplicated    Optometrist Low    Rehab Potential Good    PT Frequency --   1-2x/week   PT Duration Other (comment)   10 weeks   PT Treatment/Interventions ADLs/Self Care Home Management;Cryotherapy;Electrical Stimulation;Iontophoresis 4mg /ml Dexamethasone;Moist Heat;Traction;Balance training;Therapeutic exercise;Therapeutic activities;Functional mobility training;Stair training;Gait training;DME Instruction;Ultrasound;Neuromuscular re-education;Patient/family education;Passive range of motion;Spinal Manipulations;Joint Manipulations;Dry needling;Taping;Vasopneumatic Device;Manual techniques    PT Next Visit Plan Review HEP,  PROM only intervention    PT Home Exercise Plan [id number]    Consulted and Agree with Plan of Care Patient           Patient will benefit from skilled therapeutic intervention in order to improve the following deficits and impairments:  Decreased endurance,Hypomobility,Increased edema,Decreased activity tolerance,Decreased strength,Increased fascial restricitons,Impaired UE functional use,Pain,Decreased mobility,Decreased balance,Decreased range of motion,Impaired perceived functional ability,Improper body mechanics,Postural dysfunction,Impaired flexibility,Decreased coordination  Visit Diagnosis: Right shoulder pain, unspecified chronicity  Muscle weakness (generalized)  Localized edema     Problem List Patient Active Problem List   Diagnosis Date Noted  . Tear of right supraspinatus tendon 04/20/2020  . Impingement syndrome of right shoulder 04/20/2020  . Degenerative superior labral anterior-to-posterior (SLAP) tear of right shoulder 04/20/2020  . Rotator cuff syndrome of right shoulder 01/31/2020  . 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 Obesity 02/26/2011  .  HYPERTENSION 04/29/2007  . DEPRESSION, MAJOR, RECURRENT 04/09/2006  . ANXIETY 04/09/2006  . 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  HYPERTENSION, BENIGN SYSTEMIC 04/09/2006  . GASTROESOPHAGEAL REFLUX, NO ESOPHAGITIS 04/09/2006  . MENOPAUSAL SYNDROME 04/09/2006  . BACK PAIN, LOW 04/09/2006  . INSOMNIA NOS 04/09/2006    Chyrel Masson, PT, DPT, OCS, ATC 05/11/20  10:59 AM    [facility] Physical Therapy [address] [place], [place], [address] Phone: [phone]   Fax:  [phone]  Name: GIRL SCHISSLER MRN: [id number] Date of Birth: 05-11-1960

## 2020-05-24 ENCOUNTER — Telehealth: Payer: Self-pay | Admitting: Physician Assistant

## 2020-05-24 NOTE — Telephone Encounter (Signed)
Patient called asked if she can get a note for her employer. Patient said she went back to work on April 27, 2020. Patient will pick up note. The number to contact patient is [phone]

## 2020-05-24 NOTE — Telephone Encounter (Signed)
Letter completed and placed at the front desk. Pt was informed and stated understanding

## 2020-05-24 NOTE — Telephone Encounter (Signed)
No use of the operative extremity.  Must wear sling at all times.

## 2020-05-24 NOTE — Telephone Encounter (Signed)
Ok for return to work note? Any restrictions?

## 2020-05-29 ENCOUNTER — Telehealth: Payer: Self-pay | Admitting: Rehabilitative and Restorative Service Providers"

## 2020-05-29 ENCOUNTER — Encounter: Payer: 59 | Admitting: Rehabilitative and Restorative Service Providers"

## 2020-05-29 ENCOUNTER — Ambulatory Visit: Payer: 59 | Admitting: Orthopaedic Surgery

## 2020-05-29 NOTE — Telephone Encounter (Signed)
Called Pt. About visit today at 4pm.  Pt. Indicated she thought she had cancelled the appointment due to work.  Reminder her of MD visit and next few visits for therapy.   Chyrel Masson, PT, DPT, OCS, ATC 05/29/20  4:18 PM

## 2020-05-30 ENCOUNTER — Ambulatory Visit (INDEPENDENT_AMBULATORY_CARE_PROVIDER_SITE_OTHER): Payer: 59 | Admitting: Orthopaedic Surgery

## 2020-05-30 DIAGNOSIS — Z9889 Other specified postprocedural states: Secondary | ICD-10-CM

## 2020-05-30 NOTE — Progress Notes (Signed)
Post-Op Visit Note   Patient: Tiffany Bean           Date of Birth: Dec 01, 1960           MRN: [id number] Visit Date: 05/30/2020 PCP: Pearson Grippe, MD   Assessment & Plan:  Chief Complaint:  Chief Complaint  Patient presents with  . Right Shoulder - Routine Post Op    04/20/20 right shoulder scope RTCR   Visit Diagnoses:  1. S/P arthroscopy of right shoulder   2. S/P right rotator cuff repair     Plan:   Tiffany Bean is nearly 6 weeks status post right shoulder scope rotator cuff repair.  She is overall doing fine.  Reports no pain.  She has been compliant with the sling.  She has attended 1 session of physical therapy so far.  Right shoulder shows fully healed surgical scars.  Passive external rotation to 45 degrees forward flexion 135 degrees abduction 110 degrees all with mild discomfort.  Overall she is doing well in terms of range of motion.  At this point she can continue to progress through the physical therapy protocol.  She may begin strengthening at this time and wean her sling as tolerated.  Follow-up in 6 weeks for recheck.  Follow-Up Instructions: Return in about 6 weeks (around 07/11/2020).   Orders:  No orders of the defined types were placed in this encounter.  No orders of the defined types were placed in this encounter.   Imaging: No results found.  PMFS History: Patient Active Problem List   Diagnosis Date Noted  . Tear of right supraspinatus tendon 04/20/2020  . Impingement syndrome of right shoulder 04/20/2020  . Degenerative superior labral anterior-to-posterior (SLAP) tear of right shoulder 04/20/2020  . Rotator cuff syndrome of right shoulder 01/31/2020  . 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 Obesity 02/26/2011  . HYPERTENSION 04/29/2007  . DEPRESSION, MAJOR, RECURRENT 04/09/2006  . ANXIETY 04/09/2006  . 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 HYPERTENSION, BENIGN SYSTEMIC 04/09/2006  . GASTROESOPHAGEAL REFLUX, NO ESOPHAGITIS 04/09/2006   . MENOPAUSAL SYNDROME 04/09/2006  . BACK PAIN, LOW 04/09/2006  . INSOMNIA NOS 04/09/2006   Past Medical History:  Diagnosis Date  . Anemia   . Anxiety   . Degenerative disc disease   . Depression   . GERD (gastroesophageal reflux disease)   . Hyperlipidemia   . Hypertension    no meds after losing weight  . PONV (postoperative nausea and vomiting)     Family History  Problem Relation Age of Onset  . Heart disease Father   . COPD Father     Past Surgical History:  Procedure Laterality Date  . ABDOMINAL HYSTERECTOMY  1986  . CHOLECYSTECTOMY  1995?  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 INCONTINENCE SURGERY  1996  . SHOULDER ARTHROSCOPY WITH ROTATOR CUFF REPAIR AND SUBACROMIAL DECOMPRESSION Right 04/20/2020   Procedure: RIGHT SHOULDER ARTHROSCOPY WITH EXTENSIVE DEBRIDEMENT, ROTATOR CUFF REPAIR AND SUBACROMIAL DECOMPRESSION;  Surgeon: Tarry Kos, MD;  Location: [facility];  Service: Orthopedics;  Laterality: Right;   Social History   Occupational History  . Not on file  Tobacco Use  . Smoking status: Never Smoker  . Smokeless tobacco: Never Used  Substance and Sexual Activity  . Alcohol use: No  . Drug use: No  . Sexual activity: Not on file

## 2020-06-01 ENCOUNTER — Ambulatory Visit (INDEPENDENT_AMBULATORY_CARE_PROVIDER_SITE_OTHER): Payer: 59 | Admitting: Rehabilitative and Restorative Service Providers"

## 2020-06-01 ENCOUNTER — Encounter: Payer: Self-pay | Admitting: Rehabilitative and Restorative Service Providers"

## 2020-06-01 ENCOUNTER — Other Ambulatory Visit: Payer: Self-pay

## 2020-06-01 DIAGNOSIS — R600 Localized edema: Secondary | ICD-10-CM | POA: Diagnosis not present

## 2020-06-01 DIAGNOSIS — M25511 Pain in right shoulder: Secondary | ICD-10-CM

## 2020-06-01 DIAGNOSIS — M6281 Muscle weakness (generalized): Secondary | ICD-10-CM

## 2020-06-01 NOTE — Therapy (Signed)
[facility] [address] [place], [place], [address] Phone: [phone]   Fax:  [phone]  Physical Therapy Treatment  Patient Details  Name: Tiffany Bean MRN: [id number] Date of Birth: 21-Oct-1960 Referring Provider (PT): Jari Sportsman PA-C   Encounter Date: 06/01/2020   PT End of Session - 06/01/20 1447    Visit Number 2    Number of Visits 20    Date for PT Re-Evaluation 07/20/20    Progress Note Due on Visit 10    PT Start Time 1424    PT Stop Time 1503    PT Time Calculation (min) 39 min    Activity Tolerance Patient tolerated treatment well    Behavior During Therapy [facility] for tasks assessed/performed           Past Medical History:  Diagnosis Date  . Anemia   . Anxiety   . Degenerative disc disease   . Depression   . GERD (gastroesophageal reflux disease)   . Hyperlipidemia   . Hypertension    no meds after losing weight  . PONV (postoperative nausea and vomiting)     Past Surgical History:  Procedure Laterality Date  . ABDOMINAL HYSTERECTOMY  1986  . CHOLECYSTECTOMY  1995?  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 INCONTINENCE SURGERY  1996  . SHOULDER ARTHROSCOPY WITH ROTATOR CUFF REPAIR AND SUBACROMIAL DECOMPRESSION Right 04/20/2020   Procedure: RIGHT SHOULDER ARTHROSCOPY WITH EXTENSIVE DEBRIDEMENT, ROTATOR CUFF REPAIR AND SUBACROMIAL DECOMPRESSION;  Surgeon: Tarry Kos, MD;  Location: [facility];  Service: Orthopedics;  Laterality: Right;    There were no vitals filed for this visit.   Subjective Assessment - 06/01/20 1446    Subjective Pt. indicated no specific complaints upon arrival, doing well c HEP.    Limitations Lifting;House hold activities;Other (comment)   work   Patient Stated Goals Reduce pain, work(lift kayaks, rescue boat operation), housework, yard work  return    Currently in Pain? No/denies    Pain Score 0-No pain    Pain Onset 1 to 4 weeks ago              [facility] PT Assessment - 06/01/20 0001      Assessment   Medical Diagnosis S/P Rt shoulder RTC, biceps tenotomy, SAD    Referring Provider (PT) Jari Sportsman PA-C    Onset Date/Surgical Date 04/20/20    Hand Dominance Right      AROM   Right Shoulder Flexion 140 Degrees    Right Shoulder ABduction 130 Degrees    Right Shoulder Internal Rotation 70 Degrees    Right Shoulder External Rotation 60 Degrees      PROM   Right Shoulder Flexion 145 Degrees    Right Shoulder ABduction 135 Degrees    Right Shoulder Internal Rotation 70 Degrees    Right Shoulder External Rotation 65 Degrees                         [facility] Adult PT Treatment/Exercise - 06/01/20 0001      Shoulder Exercises: Supine   Flexion Right   x 45   Other Supine Exercises supine 1 lb bar AAROM flexion 5 sec hold x 10, ER in 45 deg abduction 5 sec hold x 10 Rt      Shoulder Exercises: Prone   Other Prone  Exercises scap retraction hold 5 sec x 10, retraction hold c GH ext 5 sec x 10      Shoulder Exercises: Sidelying   External Rotation 15 reps;Right    ABduction Right;15 reps      Shoulder Exercises: Standing   Row Theraband;Both   3 x 10   Theraband Level (Shoulder Row) Level 3 (Green)      Shoulder Exercises: ROM/Strengthening   UBE (Upper Arm Bike) Lvl 2.5 3 mins fwd/back each way      Manual Therapy   Manual therapy comments g2-g3 inferior jt mobs, ap mobs Rt GH Jt in IR/ER                  PT Education - 06/01/20 1446    Education Details HEP progression    Person(s) Educated Patient    Methods Explanation;Demonstration;Verbal cues;Handout    Comprehension Returned demonstration;Verbalized understanding            PT Short Term Goals - 06/01/20 1447      PT SHORT TERM GOAL #1   Title Patient will demonstrate independent use of home exercise program to maintain  progress from in clinic treatments.    Time 3    Period Weeks    Status Achieved    Target Date 06/01/20             PT Long Term Goals - 05/11/20 1010      PT LONG TERM GOAL #1   Title Patient will demonstrate/report pain at worst less than or equal to 2/10 to facilitate minimal limitation in daily activity secondary to pain symptoms.    Time 10    Period Weeks    Status New    Target Date 07/20/20      PT LONG TERM GOAL #2   Title Patient will demonstrate independent use of home exercise program to facilitate ability to maintain/progress functional gains from skilled physical therapy services.    Time 10    Period Weeks    Status New    Target Date 07/20/20      PT LONG TERM GOAL #3   Title Patient will demonstrate  Rt GH joint mobility WFL to facilitate usual self care, dressing, reaching overhead at PLOF s limitation due to symptoms.    Time 10    Period Weeks    Status New    Target Date 07/20/20      PT LONG TERM GOAL #4   Title Patient will demonstrate Rt UE MMT 5/5 throughout to facilitate usual lifting, carrying in functional activity to PLOF s limitation.    Time 10    Period Weeks    Status New    Target Date 07/20/20      PT LONG TERM GOAL #5   Title Pt. will demonstrate FOTO outocme > or = 65 % to indicated reduced disability due to condition.    Time 10    Period Weeks    Status New    Target Date 07/20/20                 Plan - 06/01/20 1447    Clinical Impression Statement Most recent MD visit allowed progression into active range and strengthening.   Recheck of mobility has shown gains from evaluation.  Good tolerance to new introduction of active movement.    Examination-Activity Limitations Sleep;Bend;Bed Mobility;Bathing;Carry;Dressing;Hygiene/Grooming;Lift;Reach Overhead    Examination-Participation Restrictions Cleaning;Community Activity;Driving;Laundry;Meal Prep;Yard Work    Stability/Clinical Decision Making Stable/Uncomplicated  Rehab Potential Good    PT Frequency --   1-2x/week   PT Duration Other (comment)   10 weeks   PT Treatment/Interventions ADLs/Self Care Home Management;Cryotherapy;Electrical Stimulation;Iontophoresis 4mg /ml Dexamethasone;Moist Heat;Traction;Balance training;Therapeutic exercise;Therapeutic activities;Functional mobility training;Stair training;Gait training;DME Instruction;Ultrasound;Neuromuscular re-education;Patient/family education;Passive range of motion;Spinal Manipulations;Joint Manipulations;Dry needling;Taping;Vasopneumatic Device;Manual techniques    PT Next Visit Plan Gravity reduced strengthening progression (supine, sidelying) with manual end range progression (easy on IR/behind back due to surgery)    PT Home Exercise Plan [email or web address]    Consulted and Agree with Plan of Care Patient           Patient will benefit from skilled therapeutic intervention in order to improve the following deficits and impairments:  Decreased endurance,Hypomobility,Increased edema,Decreased activity tolerance,Decreased strength,Increased fascial restricitons,Impaired UE functional use,Pain,Decreased mobility,Decreased balance,Decreased range of motion,Impaired perceived functional ability,Improper body mechanics,Postural dysfunction,Impaired flexibility,Decreased coordination  Visit Diagnosis: Right shoulder pain, unspecified chronicity  Muscle weakness (generalized)  Localized edema     Problem List Patient Active Problem List   Diagnosis Date Noted  . Tear of right supraspinatus tendon 04/20/2020  . Impingement syndrome of right shoulder 04/20/2020  . Degenerative superior labral anterior-to-posterior (SLAP) tear of right shoulder 04/20/2020  . Rotator cuff syndrome of right shoulder 01/31/2020  . 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 Obesity 02/26/2011  . HYPERTENSION 04/29/2007  . DEPRESSION, MAJOR, RECURRENT 04/09/2006  . ANXIETY 04/09/2006  . IMPOTENCE INORGANIC  04/09/2006  . HYPERTENSION, BENIGN SYSTEMIC 04/09/2006  . GASTROESOPHAGEAL REFLUX, NO ESOPHAGITIS 04/09/2006  . MENOPAUSAL SYNDROME 04/09/2006  . BACK PAIN, LOW 04/09/2006  . INSOMNIA NOS 04/09/2006    04/11/2006, PT, DPT, OCS, ATC 06/01/20  2:54 PM    [facility] Physical Therapy [address] [place], [place], [place] Phone: [phone]   Fax:  [phone]  Name: Tiffany Bean MRN: Margreta Journey Date of Birth: [birth year]

## 2020-06-06 ENCOUNTER — Ambulatory Visit: Payer: 59 | Admitting: Rehabilitative and Restorative Service Providers"

## 2020-06-06 ENCOUNTER — Encounter: Payer: Self-pay | Admitting: Rehabilitative and Restorative Service Providers"

## 2020-06-06 ENCOUNTER — Other Ambulatory Visit: Payer: Self-pay

## 2020-06-06 DIAGNOSIS — M6281 Muscle weakness (generalized): Secondary | ICD-10-CM | POA: Diagnosis not present

## 2020-06-06 DIAGNOSIS — R600 Localized edema: Secondary | ICD-10-CM

## 2020-06-06 DIAGNOSIS — M25511 Pain in right shoulder: Secondary | ICD-10-CM

## 2020-06-06 NOTE — Therapy (Signed)
[facility] Physical Therapy [address] [place], [place], [address] Phone: [phone]   Fax:  [phone]  Physical Therapy Treatment  Patient Details  Name: Tiffany Bean MRN: [id number] Date of Birth: [birth year] Referring Provider (PT): Jari Sportsman PA-C   Encounter Date: 06/06/2020   PT End of Session - 06/06/20 0843    Visit Number 3    Number of Visits 20    Date for PT Re-Evaluation 07/20/20    Progress Note Due on Visit 10    PT Start Time 0843    PT Stop Time 0923    PT Time Calculation (min) 40 min    Activity Tolerance Patient tolerated treatment well    Behavior During Therapy [facility] for tasks assessed/performed           Past Medical History:  Diagnosis Date  . Anemia   . Anxiety   . Degenerative disc disease   . Depression   . GERD (gastroesophageal reflux disease)   . Hyperlipidemia   . Hypertension    no meds after losing weight  . PONV (postoperative nausea and vomiting)     Past Surgical History:  Procedure Laterality Date  . ABDOMINAL HYSTERECTOMY  1986  . CHOLECYSTECTOMY  1995?  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 INCONTINENCE SURGERY  1996  . SHOULDER ARTHROSCOPY WITH ROTATOR CUFF REPAIR AND SUBACROMIAL DECOMPRESSION Right 04/20/2020   Procedure: RIGHT SHOULDER ARTHROSCOPY WITH EXTENSIVE DEBRIDEMENT, ROTATOR CUFF REPAIR AND SUBACROMIAL DECOMPRESSION;  Surgeon: Tarry Kos, MD;  Location: [facility];  Service: Orthopedics;  Laterality: Right;    There were no vitals filed for this visit.   Subjective Assessment - 06/06/20 0846    Subjective Pt. stated no new complaints over weekend with good performance of new HEP indicated.    Limitations Lifting;House hold activities;Other (comment)   work   Patient Stated Goals Reduce pain, work(lift kayaks, rescue boat operation),  housework, yard work return    Currently in Pain? No/denies    Pain Onset 1 to 4 weeks ago                             [facility] Adult PT Treatment/Exercise - 06/06/20 0001      Neuro Re-ed    Neuro Re-ed Details  rhythmic stabilizations in 100 deg flexion mild resistance 20 secound bouts Rt shoulder      Shoulder Exercises: Sidelying   External Rotation Right   3 x 15 2lb   External Rotation Weight (lbs) 2    Flexion Right   3 x 15   ABduction Right   x 15 1 lb, 2 x 15 2 lbs   ABduction Weight (lbs) 1, 2      Shoulder Exercises: Standing   Internal Rotation Theraband;Right   3 x 10 green band c towel at side   Theraband Level (Shoulder Internal Rotation) Level 3 (Green)    Other Standing Exercises reactive ER walk outs c arm at side c towel green band 5 sec hold x 10 Rt      Shoulder Exercises: ROM/Strengthening   UBE (Upper Arm Bike) Lvl 3 3 mins fwd/back each way      Manual Therapy   Manual therapy comments g2-g3 inferior jt mobs, prom to tolerance  PT Short Term Goals - 06/01/20 1447      PT SHORT TERM GOAL #1   Title Patient will demonstrate independent use of home exercise program to maintain progress from in clinic treatments.    Time 3    Period Weeks    Status Achieved    Target Date 06/01/20             PT Long Term Goals - 05/11/20 1010      PT LONG TERM GOAL #1   Title Patient will demonstrate/report pain at worst less than or equal to 2/10 to facilitate minimal limitation in daily activity secondary to pain symptoms.    Time 10    Period Weeks    Status New    Target Date 07/20/20      PT LONG TERM GOAL #2   Title Patient will demonstrate independent use of home exercise program to facilitate ability to maintain/progress functional gains from skilled physical therapy services.    Time 10    Period Weeks    Status New    Target Date 07/20/20      PT LONG TERM GOAL #3   Title Patient will demonstrate   Rt GH joint mobility WFL to facilitate usual self care, dressing, reaching overhead at PLOF s limitation due to symptoms.    Time 10    Period Weeks    Status New    Target Date 07/20/20      PT LONG TERM GOAL #4   Title Patient will demonstrate Rt UE MMT 5/5 throughout to facilitate usual lifting, carrying in functional activity to PLOF s limitation.    Time 10    Period Weeks    Status New    Target Date 07/20/20      PT LONG TERM GOAL #5   Title Pt. will demonstrate FOTO outocme > or = 65 % to indicated reduced disability due to condition.    Time 10    Period Weeks    Status New    Target Date 07/20/20                 Plan - 06/06/20 0909    Clinical Impression Statement Pt. able to progress in strengthening resistance at this time in gravity reduced strengthening plan.  ER fatigue noted more vs. other movements.  Continued strengthening plan at this time indicated.    Examination-Activity Limitations Sleep;Bend;Bed Mobility;Bathing;Carry;Dressing;Hygiene/Grooming;Lift;Reach Overhead    Examination-Participation Restrictions Cleaning;Community Activity;Driving;Laundry;Meal Prep;Yard Work    Stability/Clinical Decision Making Stable/Uncomplicated    Rehab Potential Good    PT Frequency --   1-2x/week   PT Duration Other (comment)   10 weeks   PT Treatment/Interventions ADLs/Self Care Home Management;Cryotherapy;Electrical Stimulation;Iontophoresis 4mg /ml Dexamethasone;Moist Heat;Traction;Balance training;Therapeutic exercise;Therapeutic activities;Functional mobility training;Stair training;Gait training;DME Instruction;Ultrasound;Neuromuscular re-education;Patient/family education;Passive range of motion;Spinal Manipulations;Joint Manipulations;Dry needling;Taping;Vasopneumatic Device;Manual techniques    PT Next Visit Plan prone y and t for middle and lower trap activation, manual end range progression (easy on IR/behind back due to surgery)    PT Home Exercise Plan  [id number]    Consulted and Agree with Plan of Care Patient           Patient will benefit from skilled therapeutic intervention in order to improve the following deficits and impairments:  Decreased endurance,Hypomobility,Increased edema,Decreased activity tolerance,Decreased strength,Increased fascial restricitons,Impaired UE functional use,Pain,Decreased mobility,Decreased balance,Decreased range of motion,Impaired perceived functional ability,Improper body mechanics,Postural dysfunction,Impaired flexibility,Decreased coordination  Visit Diagnosis: Right shoulder pain, unspecified chronicity  Muscle weakness (generalized)  Localized edema     Problem List Patient Active Problem List   Diagnosis Date Noted  . Tear of right supraspinatus tendon 04/20/2020  . Impingement syndrome of right shoulder 04/20/2020  . Degenerative superior labral anterior-to-posterior (SLAP) tear of right shoulder 04/20/2020  . Rotator cuff syndrome of right shoulder 01/31/2020  . 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 Obesity 02/26/2011  . HYPERTENSION 04/29/2007  . DEPRESSION, MAJOR, RECURRENT 04/09/2006  . ANXIETY 04/09/2006  . 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 HYPERTENSION, BENIGN SYSTEMIC 04/09/2006  . GASTROESOPHAGEAL REFLUX, NO ESOPHAGITIS 04/09/2006  . MENOPAUSAL SYNDROME 04/09/2006  . BACK PAIN, LOW 04/09/2006  . INSOMNIA NOS 04/09/2006   Chyrel Masson, PT, DPT, OCS, ATC 06/06/20  9:16 AM    [facility] Physical Therapy [address] [place], [place], [address] Phone: [phone]   Fax:  [phone]  Name: Tiffany Bean MRN: [id number] Date of Birth: 04-06-1960

## 2020-06-08 ENCOUNTER — Encounter: Payer: 59 | Admitting: Rehabilitative and Restorative Service Providers"

## 2020-06-11 ENCOUNTER — Other Ambulatory Visit: Payer: Self-pay

## 2020-06-11 ENCOUNTER — Ambulatory Visit: Payer: 59 | Admitting: Rehabilitative and Restorative Service Providers"

## 2020-06-11 ENCOUNTER — Encounter: Payer: Self-pay | Admitting: Rehabilitative and Restorative Service Providers"

## 2020-06-11 DIAGNOSIS — M25511 Pain in right shoulder: Secondary | ICD-10-CM

## 2020-06-11 DIAGNOSIS — M6281 Muscle weakness (generalized): Secondary | ICD-10-CM

## 2020-06-11 DIAGNOSIS — R600 Localized edema: Secondary | ICD-10-CM

## 2020-06-11 NOTE — Therapy (Signed)
[facility] Physical Therapy [address] [place], [place], [address] Phone: [phone]   Fax:  [phone]  Physical Therapy Treatment  Patient Details  Name: Tiffany Bean MRN: [id number] Date of Birth: Jul 07, 1960 Referring Provider (PT): Jari Sportsman PA-C   Encounter Date: 06/11/2020   PT End of Session - 06/11/20 0857    Visit Number 4    Number of Visits 20    Date for PT Re-Evaluation 07/20/20    Progress Note Due on Visit 10    PT Start Time 0846    PT Stop Time 0926    PT Time Calculation (min) 40 min    Activity Tolerance Patient tolerated treatment well    Behavior During Therapy [facility] for tasks assessed/performed           Past Medical History:  Diagnosis Date  . Anemia   . Anxiety   . Degenerative disc disease   . Depression   . GERD (gastroesophageal reflux disease)   . Hyperlipidemia   . Hypertension    no meds after losing weight  . PONV (postoperative nausea and vomiting)     Past Surgical History:  Procedure Laterality Date  . ABDOMINAL HYSTERECTOMY  1986  . CHOLECYSTECTOMY  1995?  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 INCONTINENCE SURGERY  1996  . SHOULDER ARTHROSCOPY WITH ROTATOR CUFF REPAIR AND SUBACROMIAL DECOMPRESSION Right 04/20/2020   Procedure: RIGHT SHOULDER ARTHROSCOPY WITH EXTENSIVE DEBRIDEMENT, ROTATOR CUFF REPAIR AND SUBACROMIAL DECOMPRESSION;  Surgeon: Tarry Kos, MD;  Location: [facility];  Service: Orthopedics;  Laterality: Right;    There were no vitals filed for this visit.   Subjective Assessment - 06/11/20 0856    Subjective Pt. indicated a day or two of increased sore and ache after last visit in shoulder but better with movement over weekend and nothing to report today.  Description seemed to match muscle fatigue/after exercise soreness per Pt. report.     Limitations Lifting;House hold activities;Other (comment)   work   Patient Stated Goals Reduce pain, work(lift kayaks, rescue boat operation), housework, yard work return    Currently in Pain? No/denies    Pain Score 0-No pain    Pain Location Shoulder    Pain Orientation Right    Pain Descriptors / Indicators Aching;Sore    Pain Type Surgical pain    Pain Onset More than a month ago              [facility] PT Assessment - 06/11/20 0001      Assessment   Medical Diagnosis S/P Rt shoulder RTC, biceps tenotomy, SAD    Referring Provider (PT) Jari Sportsman PA-C    Onset Date/Surgical Date 04/20/20    Hand Dominance Right      AROM   Right Shoulder Flexion 150 Degrees    Right Shoulder ABduction 155 Degrees    Right Shoulder Internal Rotation 70 Degrees   in supine 45 deg abduction   Right Shoulder External Rotation 80 Degrees   in supine 45 deg abduction     Strength   Right Shoulder Flexion 3+/5                         [facility] Adult PT Treatment/Exercise - 06/11/20 0001      Neuro Re-ed    Neuro Re-ed Details  rhythmic  stabilizations in 100 deg flexion mild resistance 20 secound bouts Rt shoulder      Shoulder Exercises: Supine   Other Supine Exercises supine 90 deg flexion circles 2 x 20 cw, ccw 2 lb      Shoulder Exercises: Sidelying   External Rotation Right   3 x 15   External Rotation Weight (lbs) 1    Flexion Right   3 x 10   ABduction Right   3 x 15   ABduction Weight (lbs) 1      Shoulder Exercises: ROM/Strengthening   UBE (Upper Arm Bike) Lvl 2.5 4 mins fwd/back each way      Manual Therapy   Manual therapy comments PROM flexion, ER/IR (70 deg abduction)  Progressive stretching for IR within tolerance.                    PT Short Term Goals - 06/01/20 1447      PT SHORT TERM GOAL #1   Title Patient will demonstrate independent use of home exercise program to maintain progress from in clinic treatments.    Time 3    Period Weeks     Status Achieved    Target Date 06/01/20             PT Long Term Goals - 06/11/20 0923      PT LONG TERM GOAL #1   Title Patient will demonstrate/report pain at worst less than or equal to 2/10 to facilitate minimal limitation in daily activity secondary to pain symptoms.    Time 10    Period Weeks    Status On-going    Target Date 07/20/20      PT LONG TERM GOAL #2   Title Patient will demonstrate independent use of home exercise program to facilitate ability to maintain/progress functional gains from skilled physical therapy services.    Time 10    Period Weeks    Status On-going    Target Date 07/20/20      PT LONG TERM GOAL #3   Title Patient will demonstrate  Rt GH joint mobility WFL to facilitate usual self care, dressing, reaching overhead at PLOF s limitation due to symptoms.    Time 10    Period Weeks    Status On-going    Target Date 07/20/20      PT LONG TERM GOAL #4   Title Patient will demonstrate Rt UE MMT 5/5 throughout to facilitate usual lifting, carrying in functional activity to PLOF s limitation.    Time 10    Period Weeks    Status On-going    Target Date 07/20/20      PT LONG TERM GOAL #5   Title Pt. will demonstrate FOTO outocme > or = 65 % to indicated reduced disability due to condition.    Time 10    Period Weeks    Status On-going    Target Date 07/20/20                 Plan - 06/11/20 3532    Clinical Impression Statement Pt. has continued to show good progress in active movement measurements.  Active movement/strength at this time showing fatigue and adaptation in clinic today respond to complaints of muscle soreness post activity.  Plan to progress strengthening as tolerated to improve functional activity level.    Examination-Activity Limitations Sleep;Bend;Bed Mobility;Bathing;Carry;Dressing;Hygiene/Grooming;Lift;Reach Overhead    Examination-Participation Restrictions Cleaning;Community Activity;Driving;Laundry;Meal Prep;Yard  Work    Stability/Clinical Decision Making Stable/Uncomplicated  Rehab Potential Good    PT Frequency --   1-2x/week   PT Duration Other (comment)   10 weeks   PT Treatment/Interventions ADLs/Self Care Home Management;Cryotherapy;Electrical Stimulation;Iontophoresis 4mg /ml Dexamethasone;Moist Heat;Traction;Balance training;Therapeutic exercise;Therapeutic activities;Functional mobility training;Stair training;Gait training;DME Instruction;Ultrasound;Neuromuscular re-education;Patient/family education;Passive range of motion;Spinal Manipulations;Joint Manipulations;Dry needling;Taping;Vasopneumatic Device;Manual techniques    PT Next Visit Plan Try prone y and t for middle and lower trap activation as muscle performance allows, graded resistance increase as tolerated.    PT Home Exercise Plan [id number]    Consulted and Agree with Plan of Care Patient           Patient will benefit from skilled therapeutic intervention in order to improve the following deficits and impairments:  Decreased endurance,Hypomobility,Increased edema,Decreased activity tolerance,Decreased strength,Increased fascial restricitons,Impaired UE functional use,Pain,Decreased mobility,Decreased balance,Decreased range of motion,Impaired perceived functional ability,Improper body mechanics,Postural dysfunction,Impaired flexibility,Decreased coordination  Visit Diagnosis: Right shoulder pain, unspecified chronicity  Muscle weakness (generalized)  Localized edema     Problem List Patient Active Problem List   Diagnosis Date Noted  . Tear of right supraspinatus tendon 04/20/2020  . Impingement syndrome of right shoulder 04/20/2020  . Degenerative superior labral anterior-to-posterior (SLAP) tear of right shoulder 04/20/2020  . Rotator cuff syndrome of right shoulder 01/31/2020  . 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 Obesity 02/26/2011  . HYPERTENSION 04/29/2007  . DEPRESSION, MAJOR, RECURRENT  04/09/2006  . ANXIETY 04/09/2006  . 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 HYPERTENSION, BENIGN SYSTEMIC 04/09/2006  . GASTROESOPHAGEAL REFLUX, NO ESOPHAGITIS 04/09/2006  . MENOPAUSAL SYNDROME 04/09/2006  . BACK PAIN, LOW 04/09/2006  . INSOMNIA NOS 04/09/2006    04/11/2006, PT, DPT, OCS, ATC 06/11/20  9:29 AM    [facility] Physical Therapy [address] [place], [place], [place] Phone: [phone]   Fax:  [phone]  Name: Tiffany Bean MRN: Margreta Journey Date of Birth: 1960-12-21

## 2020-06-13 ENCOUNTER — Encounter: Payer: Self-pay | Admitting: Rehabilitative and Restorative Service Providers"

## 2020-06-13 ENCOUNTER — Other Ambulatory Visit: Payer: Self-pay

## 2020-06-13 ENCOUNTER — Ambulatory Visit (INDEPENDENT_AMBULATORY_CARE_PROVIDER_SITE_OTHER): Payer: 59 | Admitting: Rehabilitative and Restorative Service Providers"

## 2020-06-13 DIAGNOSIS — R600 Localized edema: Secondary | ICD-10-CM | POA: Diagnosis not present

## 2020-06-13 DIAGNOSIS — M6281 Muscle weakness (generalized): Secondary | ICD-10-CM

## 2020-06-13 DIAGNOSIS — M25511 Pain in right shoulder: Secondary | ICD-10-CM | POA: Diagnosis not present

## 2020-06-13 NOTE — Therapy (Signed)
[facility] Physical Therapy [address] [place], [place], [address] Phone: [phone]   Fax:  [phone]  Physical Therapy Treatment  Patient Details  Name: Tiffany Bean MRN: [id number] Date of Birth: May 19, 1960 Referring Provider (PT): Jari Sportsman PA-C   Encounter Date: 06/13/2020   PT End of Session - 06/13/20 0849    Visit Number 5    Number of Visits 20    Date for PT Re-Evaluation 07/20/20    Progress Note Due on Visit 10    PT Start Time 0845    PT Stop Time 0925    PT Time Calculation (min) 40 min    Activity Tolerance Patient tolerated treatment well    Behavior During Therapy [facility] for tasks assessed/performed           Past Medical History:  Diagnosis Date  . Anemia   . Anxiety   . Degenerative disc disease   . Depression   . GERD (gastroesophageal reflux disease)   . Hyperlipidemia   . Hypertension    no meds after losing weight  . PONV (postoperative nausea and vomiting)     Past Surgical History:  Procedure Laterality Date  . ABDOMINAL HYSTERECTOMY  1986  . CHOLECYSTECTOMY  1995?  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 INCONTINENCE SURGERY  1996  . SHOULDER ARTHROSCOPY WITH ROTATOR CUFF REPAIR AND SUBACROMIAL DECOMPRESSION Right 04/20/2020   Procedure: RIGHT SHOULDER ARTHROSCOPY WITH EXTENSIVE DEBRIDEMENT, ROTATOR CUFF REPAIR AND SUBACROMIAL DECOMPRESSION;  Surgeon: Tarry Kos, MD;  Location: [facility];  Service: Orthopedics;  Laterality: Right;    There were no vitals filed for this visit.   Subjective Assessment - 06/13/20 0849    Subjective Pt. indicated reduced soreness complaints noted after last visit.  No pain upon arrival today.    Limitations Lifting;House hold activities;Other (comment)   work   Patient Stated Goals Reduce pain, work(lift kayaks, rescue boat operation),  housework, yard work return    Currently in Pain? No/denies    Pain Score 0-No pain    Pain Onset More than a month ago              [facility] PT Assessment - 06/13/20 0001      AROM   Right Shoulder Flexion 153 Degrees                         [facility] Adult PT Treatment/Exercise - 06/13/20 0001      Shoulder Exercises: Supine   Horizontal ABduction Both   3 x 10   Theraband Level (Shoulder Horizontal ABduction) Level 3 (Green)    Other Supine Exercises supine 90 deg flexion circles 2 x 20 cw, ccw 3 lb      Shoulder Exercises: Standing   Internal Rotation Right   3 x 10   Theraband Level (Shoulder Internal Rotation) Level 4 (Blue)    Extension Both   2 x 15   Theraband Level (Shoulder Extension) Level 4 (Blue)    Row Theraband;Both   3 x 15   Theraband Level (Shoulder Row) Level 4 (Blue)    Other Standing Exercises standing chest press blue band 3 x 15    Other Standing Exercises reactive ER walk out blue 5 sec hold x 10 Rt      Shoulder Exercises: ROM/Strengthening   UBE (Upper  Arm Bike) Lvl 3.5 3 mins fwd/back each way                    PT Short Term Goals - 06/01/20 1447      PT SHORT TERM GOAL #1   Title Patient will demonstrate independent use of home exercise program to maintain progress from in clinic treatments.    Time 3    Period Weeks    Status Achieved    Target Date 06/01/20             PT Long Term Goals - 06/11/20 0923      PT LONG TERM GOAL #1   Title Patient will demonstrate/report pain at worst less than or equal to 2/10 to facilitate minimal limitation in daily activity secondary to pain symptoms.    Time 10    Period Weeks    Status On-going    Target Date 07/20/20      PT LONG TERM GOAL #2   Title Patient will demonstrate independent use of home exercise program to facilitate ability to maintain/progress functional gains from skilled physical therapy services.    Time 10    Period Weeks    Status On-going     Target Date 07/20/20      PT LONG TERM GOAL #3   Title Patient will demonstrate  Rt GH joint mobility WFL to facilitate usual self care, dressing, reaching overhead at PLOF s limitation due to symptoms.    Time 10    Period Weeks    Status On-going    Target Date 07/20/20      PT LONG TERM GOAL #4   Title Patient will demonstrate Rt UE MMT 5/5 throughout to facilitate usual lifting, carrying in functional activity to PLOF s limitation.    Time 10    Period Weeks    Status On-going    Target Date 07/20/20      PT LONG TERM GOAL #5   Title Pt. will demonstrate FOTO outocme > or = 65 % to indicated reduced disability due to condition.    Time 10    Period Weeks    Status On-going    Target Date 07/20/20                 Plan - 06/13/20 0923    Clinical Impression Statement Progressed to include standing resistance band activity for stabilizations and scapular recruitment intervention.  Discussed with Pt. possibility of using riding lawn mower as she has shown improvements.  No complaints noted with push/pull movement against resistance in replication of mower activity.    Examination-Activity Limitations Sleep;Bend;Bed Mobility;Bathing;Carry;Dressing;Hygiene/Grooming;Lift;Reach Overhead    Examination-Participation Restrictions Cleaning;Community Activity;Driving;Laundry;Meal Prep;Yard Work    Stability/Clinical Decision Making Stable/Uncomplicated    Rehab Potential Good    PT Frequency --   1-2x/week   PT Duration Other (comment)   10 weeks   PT Treatment/Interventions ADLs/Self Care Home Management;Cryotherapy;Electrical Stimulation;Iontophoresis 4mg /ml Dexamethasone;Moist Heat;Traction;Balance training;Therapeutic exercise;Therapeutic activities;Functional mobility training;Stair training;Gait training;DME Instruction;Ultrasound;Neuromuscular re-education;Patient/family education;Passive range of motion;Spinal Manipulations;Joint Manipulations;Dry needling;Taping;Vasopneumatic  Device;Manual techniques    PT Next Visit Plan With reduced soreness, prone y and t next visit (held this visit to allow band training for mower movement).    PT Home Exercise Plan [id number]    Consulted and Agree with Plan of Care Patient           Patient will benefit from skilled therapeutic intervention in order to improve the following deficits and impairments:  Decreased endurance,Hypomobility,Increased edema,Decreased activity tolerance,Decreased strength,Increased fascial restricitons,Impaired UE functional use,Pain,Decreased mobility,Decreased balance,Decreased range of motion,Impaired perceived functional ability,Improper body mechanics,Postural dysfunction,Impaired flexibility,Decreased coordination  Visit Diagnosis: Right shoulder pain, unspecified chronicity  Muscle weakness (generalized)  Localized edema     Problem List Patient Active Problem List   Diagnosis Date Noted  . Tear of right supraspinatus tendon 04/20/2020  . Impingement syndrome of right shoulder 04/20/2020  . Degenerative superior labral anterior-to-posterior (SLAP) tear of right shoulder 04/20/2020  . Rotator cuff syndrome of right shoulder 01/31/2020  . 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 Obesity 02/26/2011  . HYPERTENSION 04/29/2007  . DEPRESSION, MAJOR, RECURRENT 04/09/2006  . ANXIETY 04/09/2006  . 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 HYPERTENSION, BENIGN SYSTEMIC 04/09/2006  . GASTROESOPHAGEAL REFLUX, NO ESOPHAGITIS 04/09/2006  . MENOPAUSAL SYNDROME 04/09/2006  . BACK PAIN, LOW 04/09/2006  . INSOMNIA NOS 04/09/2006   Chyrel Masson, PT, DPT, OCS, ATC 06/13/20  9:25 AM    [facility] Physical Therapy [address] [place], [place], [address] Phone: [phone]   Fax:  [phone]  Name: Tiffany Bean MRN: [id number] Date of Birth: 1960/11/01

## 2020-06-18 ENCOUNTER — Encounter: Payer: 59 | Admitting: Rehabilitative and Restorative Service Providers"

## 2020-06-18 ENCOUNTER — Telehealth: Payer: Self-pay | Admitting: Rehabilitative and Restorative Service Providers"

## 2020-06-18 NOTE — Telephone Encounter (Signed)
Called and left message regarding missed appointment at 8 am today.  Reminded of next appointment time.   Chyrel Masson, PT, DPT, OCS, ATC 06/18/20  8:18 AM

## 2020-06-21 ENCOUNTER — Other Ambulatory Visit: Payer: Self-pay

## 2020-06-21 ENCOUNTER — Encounter: Payer: Self-pay | Admitting: Rehabilitative and Restorative Service Providers"

## 2020-06-21 ENCOUNTER — Ambulatory Visit: Payer: 59 | Admitting: Rehabilitative and Restorative Service Providers"

## 2020-06-21 DIAGNOSIS — R600 Localized edema: Secondary | ICD-10-CM

## 2020-06-21 DIAGNOSIS — M25511 Pain in right shoulder: Secondary | ICD-10-CM | POA: Diagnosis not present

## 2020-06-21 DIAGNOSIS — M6281 Muscle weakness (generalized): Secondary | ICD-10-CM | POA: Diagnosis not present

## 2020-06-21 NOTE — Therapy (Addendum)
[facility] [address] [place], [place], [address] Phone: [phone]   Fax:  [phone]  Physical Therapy Treatment/Discharge  Patient Details  Name: Tiffany Bean MRN: [id number] Date of Birth: 1960/06/22 Referring Provider (PT): Dwana Melena PA-C   Encounter Date: 06/21/2020  PHYSICAL THERAPY DISCHARGE SUMMARY  Visits from Start of Care: 6  Current functional level related to goals / functional outcomes: See note   Remaining deficits: See note   Education / Equipment: HEP Plan: Patient agrees to discharge.  Patient goals were partially met. Patient is being discharged due to not returning since the last visit.  ?????    Scot Jun, PT, DPT, OCS, ATC 07/11/20  3:46 PM       PT End of Session - 06/21/20 0802    Visit Number 6    Number of Visits 20    Date for PT Re-Evaluation 07/20/20    Progress Note Due on Visit 10    PT Start Time 0758    PT Stop Time 0838    PT Time Calculation (min) 40 min    Activity Tolerance Patient tolerated treatment well    Behavior During Therapy [facility] for tasks assessed/performed           Past Medical History:  Diagnosis Date  . Anemia   . Anxiety   . Degenerative disc disease   . Depression   . GERD (gastroesophageal reflux disease)   . Hyperlipidemia   . Hypertension    no meds after losing weight  . PONV (postoperative nausea and vomiting)     Past Surgical History:  Procedure Laterality Date  . ABDOMINAL HYSTERECTOMY  1986  . CHOLECYSTECTOMY  1995?  Marland Kitchen GASTRIC ROUX-EN-Y  05/12/2011   Procedure: LAPAROSCOPIC ROUX-EN-Y GASTRIC;  Surgeon: Pedro Earls, MD;  Location: [facility];  Service: General;  Laterality: N/A;  . [place]  . SHOULDER ARTHROSCOPY WITH ROTATOR CUFF REPAIR AND SUBACROMIAL DECOMPRESSION Right 04/20/2020   Procedure: RIGHT SHOULDER ARTHROSCOPY WITH EXTENSIVE DEBRIDEMENT, ROTATOR CUFF REPAIR AND SUBACROMIAL DECOMPRESSION;  Surgeon: Leandrew Koyanagi, MD;  Location: [facility];  Service: Orthopedics;  Laterality: Right;    There were no vitals filed for this visit.   Subjective Assessment - 06/21/20 0801    Subjective Pt. indicated no pain today upon arrival.  Some sore/ache at times but overall doing well.  Indicated work was reason for not being able to make last missed visit.    Limitations Lifting;House hold activities;Other (comment)   work   Patient Stated Goals Reduce pain, work(lift kayaks, rescue boat operation), housework, yard work return    Currently in Pain? No/denies    Pain Onset More than a month ago              [facility] PT Assessment - 06/21/20 0001      Assessment   Medical Diagnosis S/P Rt shoulder RTC, biceps tenotomy, SAD    Referring Provider (PT) Dwana Melena PA-C    Onset Date/Surgical Date 04/20/20    Hand Dominance Right      Observation/Other Assessments   Focus on Therapeutic Outcomes (FOTO)  update 62%, predicted 65%      Strength   Right Shoulder Flexion --   7.5, 7.2 lbs   Right Shoulder ABduction --   6.5, 7.1 lbs   Right Shoulder External Rotation --   10.5, 10.8 lbs   Left Shoulder Flexion 5/5   17.2, 17.3 lbs   Left Shoulder  ABduction 5/5   16, 18.1 lbs   Left Shoulder External Rotation 5/5   16.4, 17.6 lbs                        [facility] Adult PT Treatment/Exercise - 06/21/20 0001      Shoulder Exercises: Prone   Other Prone Exercises prone y, t 2 x 10 Rt      Shoulder Exercises: Standing   Flexion Both   3 x 15 0-90   Shoulder Flexion Weight (lbs) 1    ABduction Both   x 15, x10, x 10 0-90   Shoulder ABduction Weight (lbs) 1    Other Standing Exercises eccentric Rt ER c arm at side green band 3 x 10      Shoulder Exercises: ROM/Strengthening   UBE (Upper Arm Bike) Lvl 3.5 3 mins fwd/back each way      Shoulder Exercises: Stretch   Other Shoulder Stretches doorway ER stretch 90/90 positioning 30 sec x 3 Rt                  PT  Education - 06/21/20 0820    Education Details HEP progression as noted    Person(s) Educated Patient    Methods Explanation;Demonstration;Verbal cues;Handout    Comprehension Returned demonstration;Verbalized understanding            PT Short Term Goals - 06/01/20 1447      PT SHORT TERM GOAL #1   Title Patient will demonstrate independent use of home exercise program to maintain progress from in clinic treatments.    Time 3    Period Weeks    Status Achieved    Target Date 06/01/20             PT Long Term Goals - 06/11/20 0923      PT LONG TERM GOAL #1   Title Patient will demonstrate/report pain at worst less than or equal to 2/10 to facilitate minimal limitation in daily activity secondary to pain symptoms.    Time 10    Period Weeks    Status On-going    Target Date 07/20/20      PT LONG TERM GOAL #2   Title Patient will demonstrate independent use of home exercise program to facilitate ability to maintain/progress functional gains from skilled physical therapy services.    Time 10    Period Weeks    Status On-going    Target Date 07/20/20      PT LONG TERM GOAL #3   Title Patient will demonstrate  Rt [place] joint mobility WFL to facilitate usual self care, dressing, reaching overhead at PLOF s limitation due to symptoms.    Time 10    Period Weeks    Status On-going    Target Date 07/20/20      PT LONG TERM GOAL #4   Title Patient will demonstrate Rt UE MMT 5/5 throughout to facilitate usual lifting, carrying in functional activity to PLOF s limitation.    Time 10    Period Weeks    Status On-going    Target Date 07/20/20      PT LONG TERM GOAL #5   Title Pt. will demonstrate FOTO outocme > or = 65 % to indicated reduced disability due to condition.    Time 10    Period Weeks    Status On-going    Target Date 07/20/20  Plan - 06/21/20 0805    Clinical Impression Statement FOTO reassessment revealed noted progress in functional  activity as documented in objective data.  Strength testing c dynamometry readings performed today showing marked deficits in Rt arm symptoms compared to Lt at this time.  Continued skilled Pt services indicated to continue progression towards long term goals and function return to previous level.    Examination-Activity Limitations Sleep;Bend;Bed Mobility;Bathing;Carry;Dressing;Hygiene/Grooming;Lift;Reach Overhead    Examination-Participation Restrictions Cleaning;Community Activity;Driving;Laundry;Meal Prep;Yard Work    Stability/Clinical Decision Making Stable/Uncomplicated    Rehab Potential Good    PT Frequency --   1-2x/week   PT Duration Other (comment)   10 weeks   PT Treatment/Interventions ADLs/Self Care Home Management;Cryotherapy;Electrical Stimulation;Iontophoresis 77m/ml Dexamethasone;Moist Heat;Traction;Balance training;Therapeutic exercise;Therapeutic activities;Functional mobility training;Stair training;Gait training;DME Instruction;Ultrasound;Neuromuscular re-education;Patient/family education;Passive range of motion;Spinal Manipulations;Joint Manipulations;Dry needling;Taping;Vasopneumatic Device;Manual techniques    PT Next Visit Plan Continue strengthening transitioning into standing against gravity, increased resistance as tolerated for strength improvements.  Use of HEP for mobility conitnued progression.    PT Home Exercise Plan [email or web address]    Consulted and Agree with Plan of Care Patient           Patient will benefit from skilled therapeutic intervention in order to improve the following deficits and impairments:  Decreased endurance,Hypomobility,Increased edema,Decreased activity tolerance,Decreased strength,Increased fascial restricitons,Impaired UE functional use,Pain,Decreased mobility,Decreased balance,Decreased range of motion,Impaired perceived functional ability,Improper body mechanics,Postural dysfunction,Impaired flexibility,Decreased coordination  Visit  Diagnosis: Right shoulder pain, unspecified chronicity  Muscle weakness (generalized)  Localized edema     Problem List Patient Active Problem List   Diagnosis Date Noted  . Tear of right supraspinatus tendon 04/20/2020  . Impingement syndrome of right shoulder 04/20/2020  . Degenerative superior labral anterior-to-posterior (SLAP) tear of right shoulder 04/20/2020  . Rotator cuff syndrome of right shoulder 01/31/2020  . 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 Obesity 02/26/2011  . HYPERTENSION 04/29/2007  . DEPRESSION, MAJOR, RECURRENT 04/09/2006  . ANXIETY 04/09/2006  . 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 HYPERTENSION, BENIGN SYSTEMIC 04/09/2006  . GASTROESOPHAGEAL REFLUX, NO ESOPHAGITIS 04/09/2006  . MENOPAUSAL SYNDROME 04/09/2006  . BACK PAIN, LOW 04/09/2006  . INSOMNIA NOS 04/09/2006    MScot Jun PT, DPT, OCS, ATC 06/21/20  8:31 AM    [facility] [address] [place] [address]Phone: [phone]  Fax:  [phone] Name: Tiffany FARAJMRN: [id number] of Birth: 2December 01, 1962

## 2020-06-21 NOTE — Patient Instructions (Signed)
Access Code: [email or web address] URL: [email or web address] Date: 06/21/2020 Prepared by: Chyrel Masson  Exercises Prone Scapular Slide with Shoulder Extension - 2 x daily - 7 x weekly - 1 sets - 10 reps - 5 hold Standing Shoulder Row with Anchored Resistance - 2 x daily - 7 x weekly - 10 reps - 3 sets Shoulder Extension with Resistance - 2 x daily - 7 x weekly - 10 reps - 3 sets Single Arm Doorway Pec Stretch at 90 Degrees Abduction - 2 x daily - 7 x weekly - 1 sets - 5 reps - 30 hold Standing Shoulder Flexion to 90 Degrees with Dumbbells - 2 x daily - 7 x weekly - 3 sets - 10-15 reps Shoulder Abduction with Dumbbells - Thumbs Up - 2 x daily - 7 x weekly - 3 sets - 10-15 reps Shoulder External Rotation with Anchored Resistance (Mirrored) - 2 x daily - 7 x weekly - 3 sets - 10 reps

## 2020-07-02 ENCOUNTER — Encounter: Payer: 59 | Admitting: Rehabilitative and Restorative Service Providers"

## 2020-07-02 ENCOUNTER — Telehealth: Payer: Self-pay | Admitting: Rehabilitative and Restorative Service Providers"

## 2020-07-02 NOTE — Telephone Encounter (Signed)
Called and left message about missed appointment and reminder of next appointment time.   Chyrel Masson, PT, DPT, OCS, ATC 07/02/20  3:36 PM

## 2020-07-10 ENCOUNTER — Telehealth: Payer: Self-pay | Admitting: Rehabilitative and Restorative Service Providers"

## 2020-07-10 ENCOUNTER — Encounter: Payer: 59 | Admitting: Rehabilitative and Restorative Service Providers"

## 2020-07-10 NOTE — Telephone Encounter (Signed)
Called and left message for now show (2nd in a row and 3rd total).  Reminded of MD appointment tomorrow as well as policy to cancel future physical therapy appointments until she calls back to reschedule.   Chyrel Masson, PT, DPT, OCS, ATC 07/10/20  9:06 AM

## 2020-07-11 ENCOUNTER — Ambulatory Visit (INDEPENDENT_AMBULATORY_CARE_PROVIDER_SITE_OTHER): Payer: 59 | Admitting: Orthopaedic Surgery

## 2020-07-11 DIAGNOSIS — Z9889 Other specified postprocedural states: Secondary | ICD-10-CM

## 2020-07-11 NOTE — Progress Notes (Signed)
Post-Op Visit Note   Patient: Tiffany Bean           Date of Birth: 13-Feb-1960           MRN: [id number] Visit Date: 07/11/2020 PCP: Pearson Grippe, MD   Assessment & Plan:  Chief Complaint:  Chief Complaint  Patient presents with  . Left Shoulder - Follow-up   Visit Diagnoses:  1. S/P arthroscopy of right shoulder   2. S/P right rotator cuff repair     Plan:   Jewelene is nearly 3 months status post rotator cuff repair.  She is overall doing well.  She has missed her last 2 physical therapy sessions.  She has been doing a lot of exercise on her own.  She reports no pain with daily activities.  She still has pain discomfort with lifting more than 10 pounds.  She works at the [employer]  Right shoulder shows fully healed surgical scars.  Passive range of motion is symmetric to contralateral side.  Strength is significantly improved without pain.  Manual muscle testing of the rotator cuff shows 5- out of 5 strength.  [name] has made excellent progress in the last 6 weeks.  She can discontinue outpatient PT at this point and just do home exercises.  After discussion about her job duties she should not handle any contacts or lift more than 10 pounds for another 3 months while she is recuperating.  Work note provided today.  Recheck in 3 months.  Follow-Up Instructions: Return in about 3 months (around 10/11/2020).   Orders:  No orders of the defined types were placed in this encounter.  No orders of the defined types were placed in this encounter.   Imaging: No results found.  PMFS History: Patient Active Problem List   Diagnosis Date Noted  . Tear of right supraspinatus tendon 04/20/2020  . Impingement syndrome of right shoulder 04/20/2020  . Degenerative superior labral anterior-to-posterior (SLAP) tear of right shoulder 04/20/2020  . Rotator cuff syndrome of right shoulder 01/31/2020  . 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 Obesity 02/26/2011  .  HYPERTENSION 04/29/2007  . DEPRESSION, MAJOR, RECURRENT 04/09/2006  . ANXIETY 04/09/2006  . 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 HYPERTENSION, BENIGN SYSTEMIC 04/09/2006  . GASTROESOPHAGEAL REFLUX, NO ESOPHAGITIS 04/09/2006  . MENOPAUSAL SYNDROME 04/09/2006  . BACK PAIN, LOW 04/09/2006  . INSOMNIA NOS 04/09/2006   Past Medical History:  Diagnosis Date  . Anemia   . Anxiety   . Degenerative disc disease   . Depression   . GERD (gastroesophageal reflux disease)   . Hyperlipidemia   . Hypertension    no meds after losing weight  . PONV (postoperative nausea and vomiting)     Family History  Problem Relation Age of Onset  . Heart disease Father   . COPD Father     Past Surgical History:  Procedure Laterality Date  . ABDOMINAL HYSTERECTOMY  1986  . CHOLECYSTECTOMY  1995?  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 INCONTINENCE SURGERY  1996  . SHOULDER ARTHROSCOPY WITH ROTATOR CUFF REPAIR AND SUBACROMIAL DECOMPRESSION Right 04/20/2020   Procedure: RIGHT SHOULDER ARTHROSCOPY WITH EXTENSIVE DEBRIDEMENT, ROTATOR CUFF REPAIR AND SUBACROMIAL DECOMPRESSION;  Surgeon: Tarry Kos, MD;  Location: [facility];  Service: Orthopedics;  Laterality: Right;   Social History   Occupational History  .  Not on file  Tobacco Use  . Smoking status: Never Smoker  . Smokeless tobacco: Never Used  Substance and Sexual Activity  . Alcohol use: No  . Drug use: No  . Sexual activity: Not on file

## 2020-07-16 ENCOUNTER — Encounter: Payer: 59 | Admitting: Rehabilitative and Restorative Service Providers"

## 2020-07-19 ENCOUNTER — Encounter: Payer: 59 | Admitting: Rehabilitative and Restorative Service Providers"

## 2020-07-23 ENCOUNTER — Encounter: Payer: 59 | Admitting: Rehabilitative and Restorative Service Providers"

## 2020-07-25 ENCOUNTER — Encounter: Payer: 59 | Admitting: Rehabilitative and Restorative Service Providers"

## 2020-07-30 ENCOUNTER — Encounter: Payer: 59 | Admitting: Rehabilitative and Restorative Service Providers"

## 2020-08-01 ENCOUNTER — Encounter: Payer: 59 | Admitting: Rehabilitative and Restorative Service Providers"

## 2020-09-07 ENCOUNTER — Encounter: Payer: Self-pay | Admitting: Orthopaedic Surgery

## 2020-09-07 ENCOUNTER — Ambulatory Visit (INDEPENDENT_AMBULATORY_CARE_PROVIDER_SITE_OTHER): Payer: 59 | Admitting: Orthopaedic Surgery

## 2020-09-07 ENCOUNTER — Other Ambulatory Visit: Payer: Self-pay

## 2020-09-07 VITALS — Ht 62.0 in | Wt 151.0 lb

## 2020-09-07 DIAGNOSIS — Z9889 Other specified postprocedural states: Secondary | ICD-10-CM

## 2020-09-07 MED ORDER — PREDNISONE 10 MG (21) PO TBPK: ORAL_TABLET | ORAL | 0 refills | Status: AC

## 2020-09-07 NOTE — Progress Notes (Signed)
Post-Op Visit Note   Patient: Tiffany Bean           Date of Birth: [birth year]           MRN: [id number] Visit Date: 09/07/2020 PCP: Tiffany Grippe, MD   Assessment & Plan:  Chief Complaint:  Chief Complaint  Patient presents with   Right Shoulder - Pain    S/p arthroscopy 04/20/2020   Visit Diagnoses:  1. S/P arthroscopy of right shoulder   2. S/P right rotator cuff repair     Plan: Tiffany Bean is status post right shoulder arthroscopy rotator cuff repair on 04/20/2020.  She recently started having increased right shoulder pain about a week and a half after she started assisting her mother for a pelvic fracture.  She has been very active with lifting her and performing ADLs.  She has been taking Advil.  Pain is relatively mild at baseline and increased with activity.  Right shoulder shows fully healed surgical scars.  Mild discomfort with terminal flexion.  Abduction greater than 90 without pain.  Good strength with empty can testing with mild pain.  Belly press is normal.  Tiffany Bean likely irritated shoulder from overactivity based on my assessment.  I recommend relative rest and she can wear a sling for 1-2 weeks at most but I don't want her to develop a frozen shoulder.  Mainly needs to try to back off on activity.  I sent in prednisone dose pack to bring down inflammation.  We'll see her back as needed.  Follow-Up Instructions: No follow-ups on file.   Orders:  No orders of the defined types were placed in this encounter.  Meds ordered this encounter  Medications   predniSONE (STERAPRED UNI-PAK 21 TAB) 10 MG (21) TBPK tablet    Sig: Take as directed    Dispense:  21 tablet    Refill:  0    Imaging: No results found.  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   Obesity 02/26/2011   HYPERTENSION 04/29/2007   DEPRESSION, MAJOR, RECURRENT 04/09/2006   ANXIETY 04/09/2006   IMPOTENCE INORGANIC 04/09/2006   HYPERTENSION, BENIGN SYSTEMIC 04/09/2006   GASTROESOPHAGEAL REFLUX, NO ESOPHAGITIS 04/09/2006   MENOPAUSAL SYNDROME 04/09/2006   BACK PAIN, LOW 04/09/2006   INSOMNIA NOS 04/09/2006   Past Medical History:  Diagnosis Date   Anemia    Anxiety    Degenerative disc disease    Depression    GERD (gastroesophageal reflux disease)    Hyperlipidemia    Hypertension    no meds after losing weight   PONV (postoperative nausea and vomiting)     Family History  Problem Relation Age of Onset   Heart disease Father    COPD Father     Past Surgical History:  Procedure Laterality Date   ABDOMINAL HYSTERECTOMY  1986   CHOLECYSTECTOMY  1995?   GASTRIC ROUX-EN-Y  05/12/2011   Procedure: LAPAROSCOPIC ROUX-EN-Y GASTRIC;  Surgeon: Valarie Merino, MD;  Location[facility];  Service: General;  Laterality: N/A;   INCONTINENCE SURGERY  1996   SHOULDER ARTHROSCOPY WITH ROTATOR CUFF REPAIR AND SUBACROMIAL DECOMPRESSION Right 04/20/2020   Procedure: RIGHT SHOULDER ARTHROSCOPY WITH EXTENSIVE DEBRIDEMENT, ROTATOR CUFF REPAIR AND  SUBACROMIAL DECOMPRESSION;  Surgeon: Tarry Kos, MD;  Location: [facility];  Service: Orthopedics;  Laterality: Right;   Social History   Occupational History   Not on file  Tobacco Use   Smoking status: Never   Smokeless tobacco: Never  Substance and Sexual Activity   Alcohol use: No   Drug use: No   Sexual activity: Not on file

## 2021-01-14 ENCOUNTER — Other Ambulatory Visit: Payer: Self-pay | Admitting: Registered Nurse

## 2021-01-14 DIAGNOSIS — Z1231 Encounter for screening mammogram for malignant neoplasm of breast: Secondary | ICD-10-CM

## 2021-01-30 ENCOUNTER — Other Ambulatory Visit: Payer: Self-pay | Admitting: Registered Nurse

## 2021-02-15 ENCOUNTER — Ambulatory Visit: Payer: 59

## 2021-03-07 ENCOUNTER — Other Ambulatory Visit: Payer: Self-pay

## 2021-03-07 ENCOUNTER — Ambulatory Visit
Admission: RE | Admit: 2021-03-07 | Discharge: 2021-03-07 | Disposition: A | Discharge status: Home / Self Care | Payer: Self-pay | Source: Ambulatory Visit | Attending: Registered Nurse | Admitting: Registered Nurse

## 2021-03-07 DIAGNOSIS — Z1231 Encounter for screening mammogram for malignant neoplasm of breast: Secondary | ICD-10-CM

## 2022-03-05 ENCOUNTER — Other Ambulatory Visit (HOSPITAL_COMMUNITY): Payer: Self-pay | Admitting: Registered Nurse

## 2022-03-05 DIAGNOSIS — Z8249 Family history of ischemic heart disease and other diseases of the circulatory system: Secondary | ICD-10-CM

## 2022-03-19 ENCOUNTER — Ambulatory Visit (HOSPITAL_COMMUNITY): Admission: RE | Admit: 2022-03-19 | Payer: Self-pay | Source: Ambulatory Visit

## 2022-03-19 ENCOUNTER — Encounter (HOSPITAL_COMMUNITY): Payer: Self-pay

## 2022-06-06 IMAGING — MG DIGITAL SCREENING BREAST BILAT IMPLANT W/ TOMO W/ CAD
8 of 12 series · 8 of 28 positions shown · non-contrast
Comparison: Previous exam(s).

CLINICAL DATA: Screening.

EXAM:
DIGITAL SCREENING BILATERAL MAMMOGRAM WITH IMPLANTS, CAD AND
TOMOSYNTHESIS
TECHNIQUE: Bilateral screening digital craniocaudal and mediolateral oblique
mammograms were obtained. Bilateral screening digital breast
tomosynthesis was performed. The images were evaluated with
computer-aided detection. Standard and/or implant displaced views
were performed.

[R MLO]
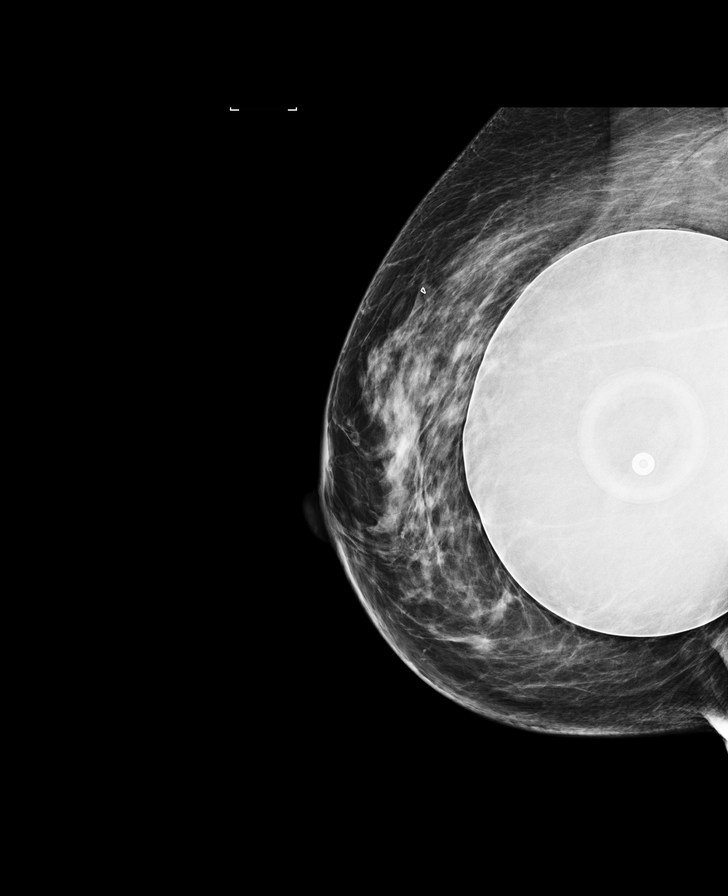

[L MLO]
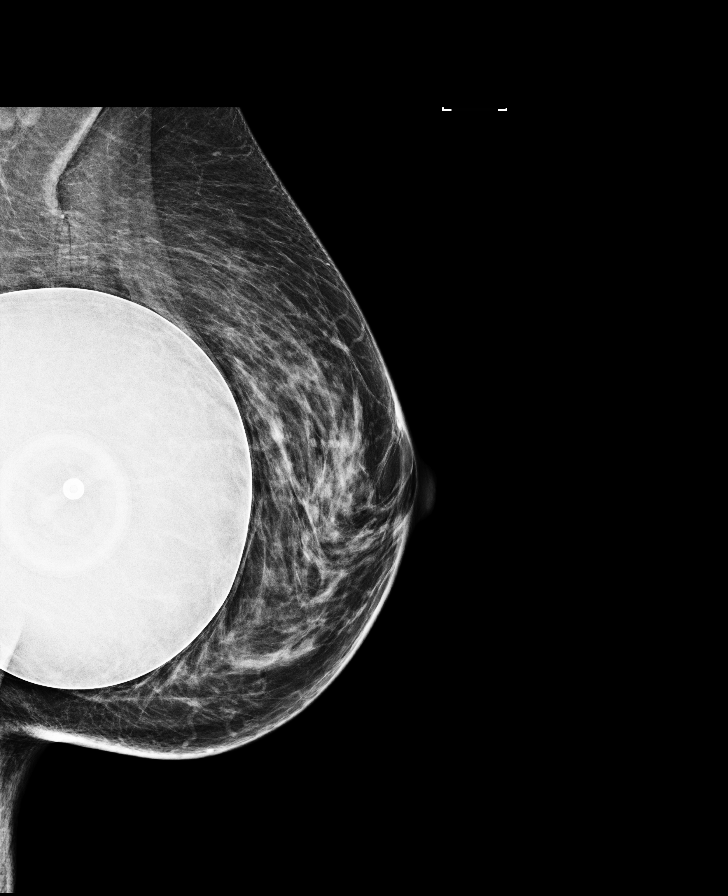

[R CC]
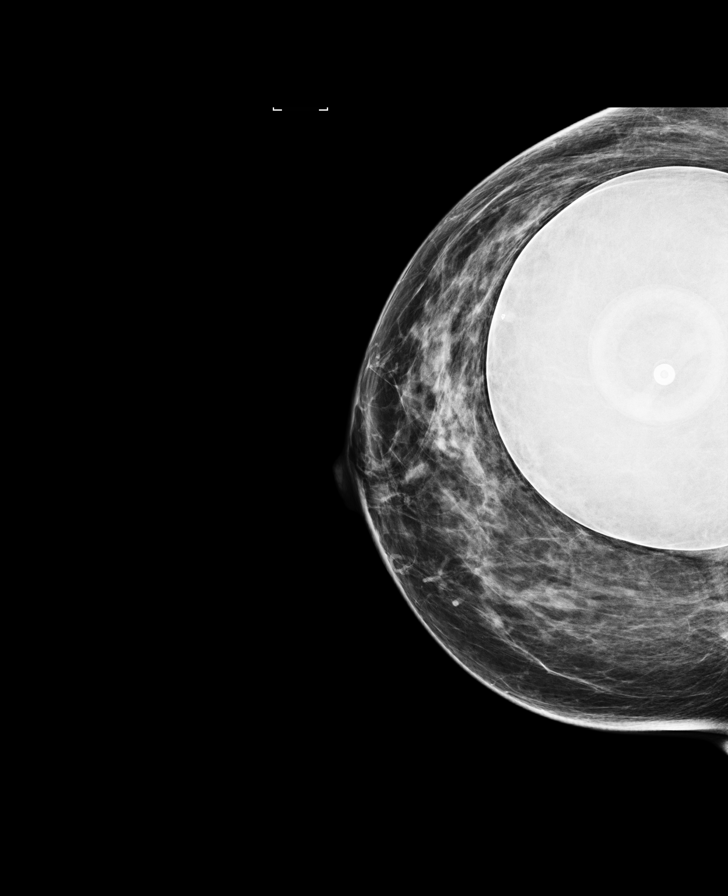

[L CC]
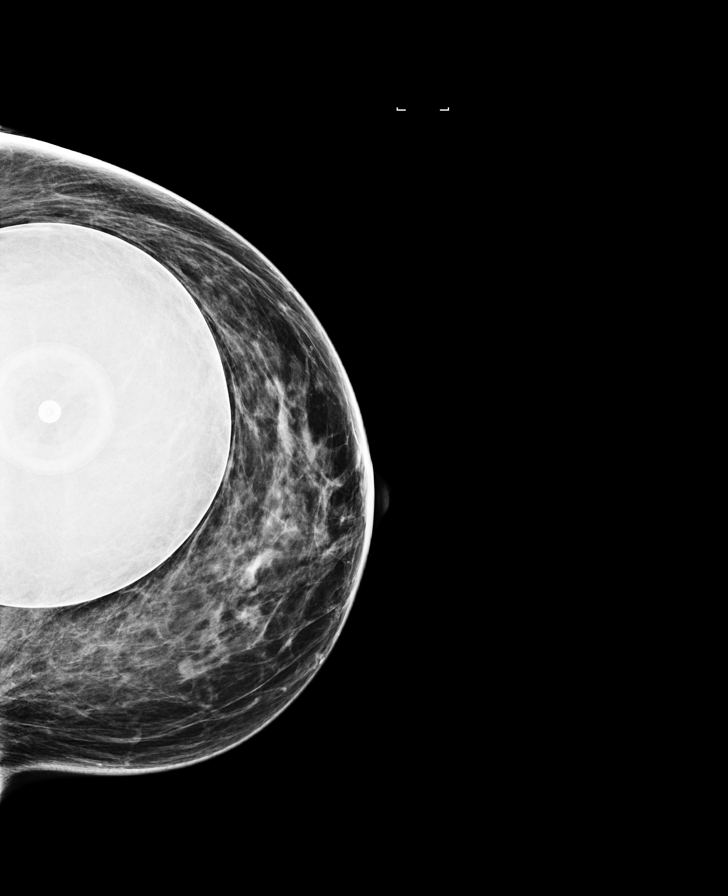

[L CC synth-2D]
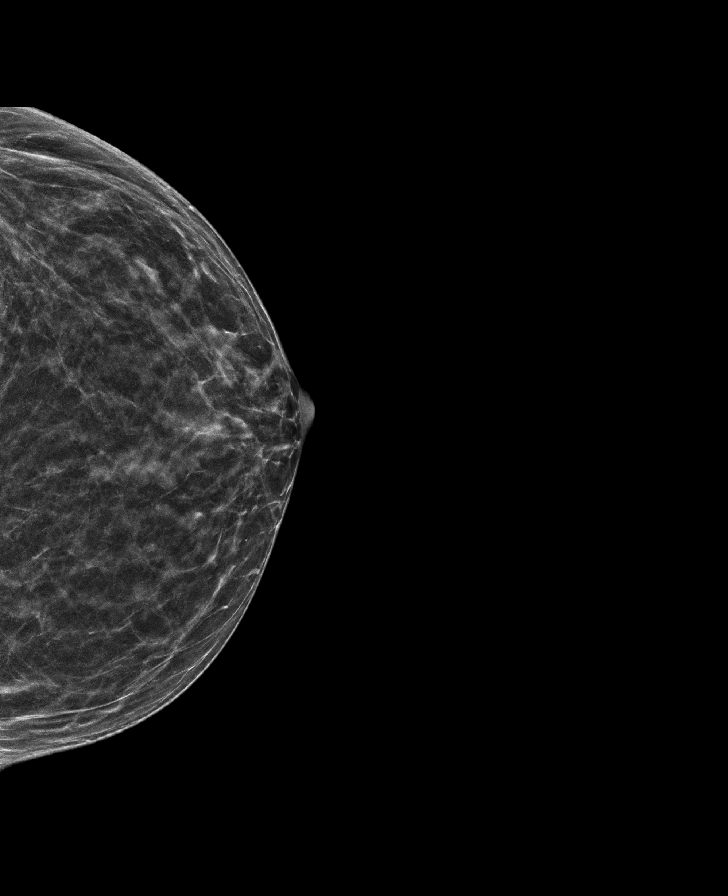

[R MLO synth-2D]
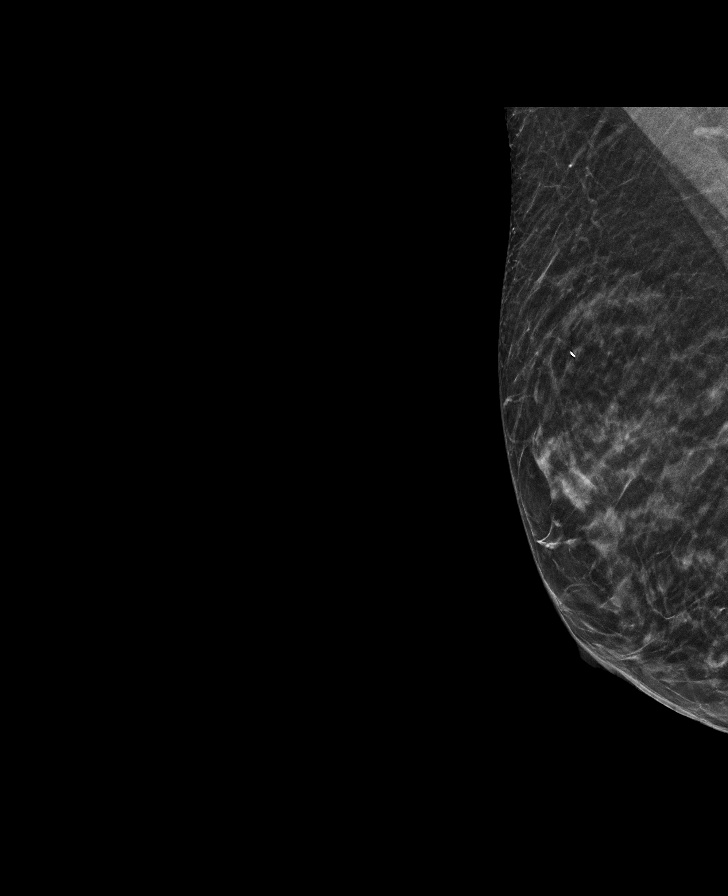

[R CC synth-2D]
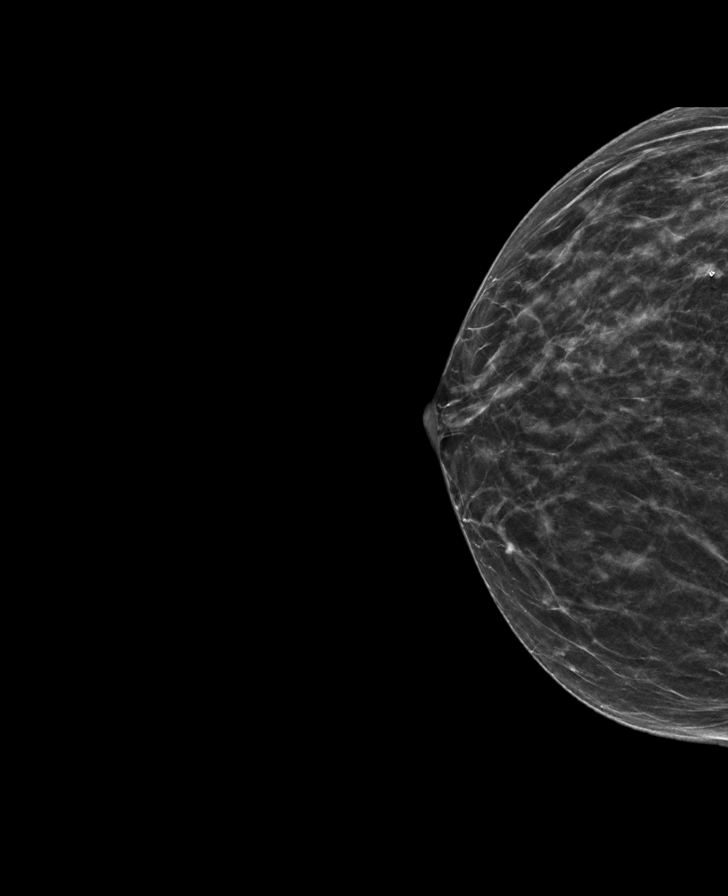

[L MLO synth-2D]
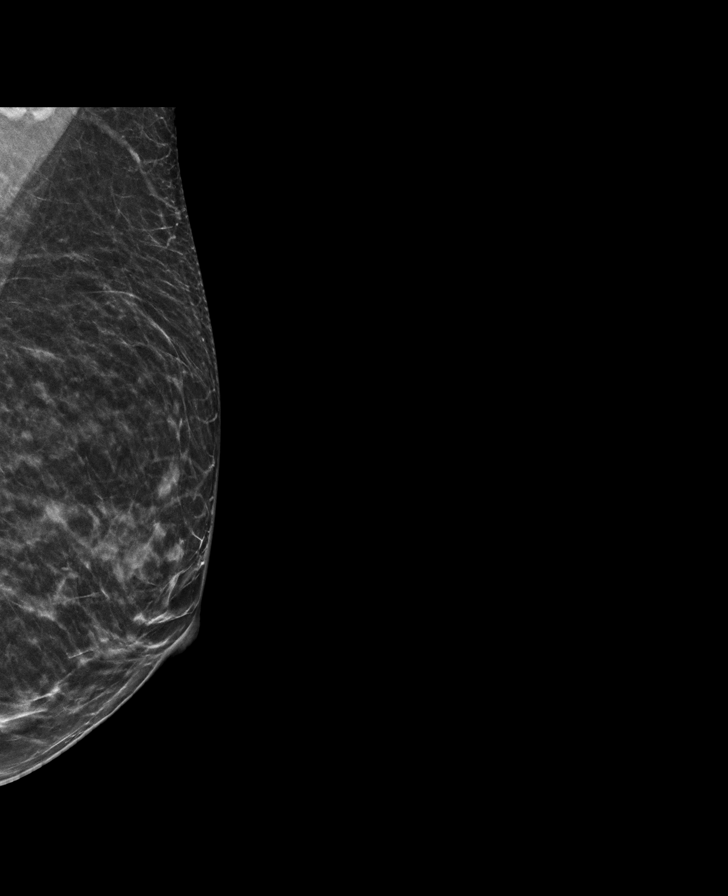

[8 of 28 positions shown; findings below may reference images not displayed]

ACR Breast Density Category b: There are scattered areas of
fibroglandular density.
FINDINGS: The patient has retropectoral implants. There are no findings
suspicious for malignancy.
IMPRESSION: No mammographic evidence of malignancy. A result letter of this
screening mammogram will be mailed directly to the patient.

RECOMMENDATION:
Screening mammogram in one year. (Code:SE-S-JMG)

BI-RADS CATEGORY  1:  Negative.

## 2022-09-24 ENCOUNTER — Other Ambulatory Visit (HOSPITAL_COMMUNITY): Payer: Self-pay

## 2022-10-08 ENCOUNTER — Ambulatory Visit (HOSPITAL_COMMUNITY): Payer: Self-pay | Attending: Registered Nurse

## 2023-06-18 ENCOUNTER — Ambulatory Visit: Payer: Commercial Managed Care - HMO | Attending: Internal Medicine | Admitting: Internal Medicine

## 2023-06-18 VITALS — BP 118/70 | HR 55 | Ht 62.0 in | Wt 151.0 lb

## 2023-06-18 DIAGNOSIS — I288 Other diseases of pulmonary vessels: Secondary | ICD-10-CM | POA: Insufficient documentation

## 2023-06-18 DIAGNOSIS — I351 Nonrheumatic aortic (valve) insufficiency: Secondary | ICD-10-CM | POA: Diagnosis not present

## 2023-06-18 DIAGNOSIS — I10 Essential (primary) hypertension: Secondary | ICD-10-CM

## 2023-06-18 DIAGNOSIS — R011 Cardiac murmur, unspecified: Secondary | ICD-10-CM | POA: Diagnosis not present

## 2023-06-18 NOTE — Patient Instructions (Signed)
 Medication Instructions:  Your physician recommends that you continue on your current medications as directed. Please refer to the Current Medication list given to you today.  *If you need a refill on your cardiac medications before your next appointment, please call your pharmacy*  Lab Work: NONE  If you have labs (blood work) drawn today and your tests are completely normal, you will receive your results only by: MyChart Message (if you have MyChart) OR A paper copy in the mail If you have any lab test that is abnormal or we need to change your treatment, we will call you to review the results.  Testing/Procedures: FEB 2026- - - .Your physician has requested that you have an echocardiogram. Echocardiography is a painless test that uses sound waves to create images of your heart. It provides your doctor with information about the size and shape of your heart and how well your heart's chambers and valves are working. This procedure takes approximately one hour. There are no restrictions for this procedure. Please do NOT wear cologne, perfume, aftershave, or lotions (deodorant is allowed). Please arrive 15 minutes prior to your appointment time.  Please note: We ask at that you not bring children with you during ultrasound (echo/ vascular) testing. Due to room size and safety concerns, children are not allowed in the ultrasound rooms during exams. Our front office staff cannot provide observation of children in our lobby area while testing is being conducted. An adult accompanying a patient to their appointment will only be allowed in the ultrasound room at the discretion of the ultrasound technician under special circumstances. We apologize for any inconvenience.   Follow-Up: At [facility], you and your health needs are our priority.  As part of our continuing mission to provide you with exceptional heart care, our providers are all part of one team.  This team includes your primary  Cardiologist (physician) and Advanced Practice Providers or APPs (Physician Assistants and Nurse Practitioners) who all work together to provide you with the care you need, when you need it.  Your next appointment:   10 month(s)  Provider:   Jann Melody, MD    We recommend signing up for the patient portal called "MyChart".  Sign up information is provided on this After Visit Summary.  MyChart is used to connect with patients for Virtual Visits (Telemedicine).  Patients are able to view lab/test results, encounter notes, upcoming appointments, etc.  Non-urgent messages can be sent to your provider as well.   To learn more about what you can do with MyChart, go to [email or web address].

## 2023-06-18 NOTE — Progress Notes (Signed)
 Cardiology Office Note:  .    Date:  06/18/2023  ID:  Fredrik Jensen, DOB 04/29/1960, MRN [id number] PCP: Vanita Gens, MD  [facility] Providers Cardiologist:  Jann Melody, MD     CC: Echo review Consulted for the evaluation of AI and MR at the behest of Ms. Janan Mead, FNP   History of Present Illness: .    Tiffany Bean is a 63 y.o. female who presents for evaluation of a new heart murmur and abnormal echocardiogram findings. She was referred by her family practice nurse practitioner, Mary Provost, for evaluation of a new heart murmur and abnormal echocardiogram findings.  She was found to have a new heart murmur by her family practice nurse practitioner. An echocardiogram revealed a dilated left atrium and pulmonary artery. She is asymptomatic with respect to her heart condition, with no chest pain, breathing issues, or palpitations.  She has a family history of early coronary disease; her son had a percutaneous coronary intervention in his forties, and her father had open heart surgery for coronary arteries.  She has a history of iron deficiency anemia, which was treated by her nurse practitioner. Her iron levels are currently normal, but this did not make a difference in her symptoms.  She experiences significant leg pain, particularly when lying down, which affects her sleep. The pain is described as being all over, including her knees and calves, and worsens with rest. She also reports sciatica, which prevents her from sleeping on her side.  She is very active, having retired but staying busy with activities such as towing cars, remodeling her house, and helping her family. She helps her mother and watches her grandson regularly.  Discussed the use of AI scribe software for clinical note transcription with the patient, who gave verbal consent to proceed.   Relevant histories: .  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    Physical Exam:    VS:  BP 118/70 (BP Location: Left Arm)   Pulse (!) 55   Ht 5\' 2"  (1.575 m)   Wt 151 lb (68.5 kg)   SpO2 96%   BMI 27.62 kg/m    Wt Readings from Last 3 Encounters:  06/18/23 151 lb (68.5 kg)  09/07/20 151 lb (68.5 kg)  04/20/20 151 lb 3.8 oz (68.6 kg)    Gen: no distress   Neck: No JVD Cardiac: No Rubs or Gallops, systolic murmur, RRR +2 radial pulses Respiratory: Clear to auscultation bilaterally, normal effort, normal  respiratory rate GI: Soft, nontender, non-distended  MS: No  edema;  moves all extremities Integument: Skin feels warm Neuro:  At time of evaluation, alert and oriented to person/place/time/situation  Psych: Normal affect, patient feels ok   ASSESSMENT AND PLAN: .     An EKG was ordered for bradycardia and shows sinus bradycardia no HB  Mitral regurgitation Suspected mild mitral regurgitation based on echocardiogram findings. Asymptomatic with no significant clinical symptoms. Monitoring is recommended as the condition may not progress to significant disease. - Repeat echocardiogram in February 2026. - Schedule follow-up appointment in March 2026 to review echocardiogram results.  Aortic regurgitation Mild aortic regurgitation on echocardiogram. Asymptomatic with no significant clinical symptoms. Monitoring is recommended as the condition may not progress to significant disease.  Pulmonic regurgitation Mild pulmonic regurgitation on echocardiogram. Asymptomatic with no significant clinical symptoms. Monitoring is recommended as the condition may not progress  to significant disease.   Left atrial dilation Moderate left atrial dilation on echocardiogram, possibly related to valvular insufficiency. Asymptomatic with no significant clinical symptoms. Monitoring is recommended as the condition may not progress to significant disease.   Pulmonary artery dilation Mild pulmonary artery dilation on echocardiogram.  Asymptomatic with no significant clinical symptoms. Monitoring is recommended as the condition may not progress to significant disease.   Iron deficiency anemia Iron deficiency anemia with current normal iron levels and no significant symptoms.  Bradycardia - Sinus no issues; monitor  If no issues 04/2024 will space to PRN with echo follow up  Gloriann Larger, MD FASE [facility] Cardiologist [facility]  [facility]  [address] [place], [address] [place], [place] [address] [phone]  9:39 AM

## 2024-03-14 ENCOUNTER — Ambulatory Visit (HOSPITAL_COMMUNITY): Payer: Self-pay

## 2024-03-15 ENCOUNTER — Ambulatory Visit (HOSPITAL_COMMUNITY): Admission: RE | Admit: 2024-03-15 | Discharge: 2024-03-15 | Payer: Self-pay | Attending: Internal Medicine

## 2024-03-15 DIAGNOSIS — I351 Nonrheumatic aortic (valve) insufficiency: Secondary | ICD-10-CM

## 2024-03-15 DIAGNOSIS — I288 Other diseases of pulmonary vessels: Secondary | ICD-10-CM

## 2024-03-15 LAB — ECHOCARDIOGRAM COMPLETE
Area-P 1/2: 3.87 cm2
S' Lateral: 2.8 cm

## 2024-03-16 ENCOUNTER — Ambulatory Visit: Payer: Self-pay | Admitting: Internal Medicine
